# Patient Record
Sex: Female | Born: 1935 | Race: White | Hispanic: No
Health system: Southern US, Community
[De-identification: ages and names within clinical notes are randomized; demographics above are authoritative.]

## PROBLEM LIST (undated history)

## (undated) ENCOUNTER — Emergency Department (HOSPITAL_BASED_OUTPATIENT_CLINIC_OR_DEPARTMENT_OTHER): Discharge status: Against Medical Advice | Payer: PRIVATE HEALTH INSURANCE

## (undated) DIAGNOSIS — F329 Major depressive disorder, single episode, unspecified: Secondary | ICD-10-CM

## (undated) DIAGNOSIS — K5792 Diverticulitis of intestine, part unspecified, without perforation or abscess without bleeding: Secondary | ICD-10-CM

## (undated) DIAGNOSIS — G629 Polyneuropathy, unspecified: Secondary | ICD-10-CM

## (undated) DIAGNOSIS — J309 Allergic rhinitis, unspecified: Secondary | ICD-10-CM

## (undated) DIAGNOSIS — H9190 Unspecified hearing loss, unspecified ear: Secondary | ICD-10-CM

## (undated) DIAGNOSIS — I739 Peripheral vascular disease, unspecified: Secondary | ICD-10-CM

## (undated) DIAGNOSIS — M25552 Pain in left hip: Secondary | ICD-10-CM

## (undated) DIAGNOSIS — F32A Depression, unspecified: Secondary | ICD-10-CM

## (undated) DIAGNOSIS — M545 Low back pain, unspecified: Secondary | ICD-10-CM

## (undated) DIAGNOSIS — K279 Peptic ulcer, site unspecified, unspecified as acute or chronic, without hemorrhage or perforation: Secondary | ICD-10-CM

## (undated) DIAGNOSIS — K589 Irritable bowel syndrome without diarrhea: Secondary | ICD-10-CM

## (undated) DIAGNOSIS — R519 Headache, unspecified: Secondary | ICD-10-CM

## (undated) DIAGNOSIS — M549 Dorsalgia, unspecified: Secondary | ICD-10-CM

## (undated) DIAGNOSIS — I10 Essential (primary) hypertension: Secondary | ICD-10-CM

## (undated) DIAGNOSIS — F0390 Unspecified dementia without behavioral disturbance: Secondary | ICD-10-CM

## (undated) DIAGNOSIS — K5900 Constipation, unspecified: Secondary | ICD-10-CM

## (undated) DIAGNOSIS — L309 Dermatitis, unspecified: Secondary | ICD-10-CM

## (undated) DIAGNOSIS — G8929 Other chronic pain: Secondary | ICD-10-CM

## (undated) DIAGNOSIS — R51 Headache: Secondary | ICD-10-CM

## (undated) DIAGNOSIS — R251 Tremor, unspecified: Secondary | ICD-10-CM

## (undated) DIAGNOSIS — I719 Aortic aneurysm of unspecified site, without rupture: Secondary | ICD-10-CM

## (undated) DIAGNOSIS — I359 Nonrheumatic aortic valve disorder, unspecified: Secondary | ICD-10-CM

## (undated) HISTORY — PX: OTHER SURGICAL HISTORY: SHX169

---

## 2012-02-11 ENCOUNTER — Emergency Department (HOSPITAL_COMMUNITY): Payer: Medicare Other

## 2012-02-11 ENCOUNTER — Observation Stay (HOSPITAL_COMMUNITY)
Admission: EM | Admit: 2012-02-11 | Discharge: 2012-02-12 | Payer: Medicare Other | Attending: Family Medicine | Admitting: Family Medicine

## 2012-02-11 ENCOUNTER — Encounter (HOSPITAL_COMMUNITY): Payer: Self-pay

## 2012-02-11 DIAGNOSIS — R42 Dizziness and giddiness: Principal | ICD-10-CM | POA: Diagnosis present

## 2012-02-11 DIAGNOSIS — F329 Major depressive disorder, single episode, unspecified: Secondary | ICD-10-CM | POA: Insufficient documentation

## 2012-02-11 DIAGNOSIS — I671 Cerebral aneurysm, nonruptured: Secondary | ICD-10-CM | POA: Insufficient documentation

## 2012-02-11 DIAGNOSIS — G609 Hereditary and idiopathic neuropathy, unspecified: Secondary | ICD-10-CM | POA: Insufficient documentation

## 2012-02-11 DIAGNOSIS — F0280 Dementia in other diseases classified elsewhere without behavioral disturbance: Secondary | ICD-10-CM | POA: Insufficient documentation

## 2012-02-11 DIAGNOSIS — F0390 Unspecified dementia without behavioral disturbance: Secondary | ICD-10-CM | POA: Diagnosis present

## 2012-02-11 DIAGNOSIS — I6529 Occlusion and stenosis of unspecified carotid artery: Secondary | ICD-10-CM | POA: Insufficient documentation

## 2012-02-11 DIAGNOSIS — F3289 Other specified depressive episodes: Secondary | ICD-10-CM | POA: Insufficient documentation

## 2012-02-11 DIAGNOSIS — G309 Alzheimer's disease, unspecified: Secondary | ICD-10-CM | POA: Insufficient documentation

## 2012-02-11 DIAGNOSIS — I658 Occlusion and stenosis of other precerebral arteries: Secondary | ICD-10-CM | POA: Insufficient documentation

## 2012-02-11 DIAGNOSIS — I10 Essential (primary) hypertension: Secondary | ICD-10-CM | POA: Diagnosis present

## 2012-02-11 DIAGNOSIS — I672 Cerebral atherosclerosis: Secondary | ICD-10-CM | POA: Insufficient documentation

## 2012-02-11 DIAGNOSIS — F0150 Vascular dementia without behavioral disturbance: Secondary | ICD-10-CM | POA: Insufficient documentation

## 2012-02-11 DIAGNOSIS — F32A Depression, unspecified: Secondary | ICD-10-CM | POA: Diagnosis present

## 2012-02-11 DIAGNOSIS — G629 Polyneuropathy, unspecified: Secondary | ICD-10-CM | POA: Diagnosis present

## 2012-02-11 DIAGNOSIS — Z8673 Personal history of transient ischemic attack (TIA), and cerebral infarction without residual deficits: Secondary | ICD-10-CM | POA: Insufficient documentation

## 2012-02-11 DIAGNOSIS — W19XXXA Unspecified fall, initial encounter: Secondary | ICD-10-CM | POA: Diagnosis present

## 2012-02-11 DIAGNOSIS — R1032 Left lower quadrant pain: Secondary | ICD-10-CM | POA: Insufficient documentation

## 2012-02-11 DIAGNOSIS — R51 Headache: Secondary | ICD-10-CM | POA: Insufficient documentation

## 2012-02-11 DIAGNOSIS — Z9181 History of falling: Secondary | ICD-10-CM | POA: Insufficient documentation

## 2012-02-11 HISTORY — DX: Dorsalgia, unspecified: M54.9

## 2012-02-11 HISTORY — DX: Constipation, unspecified: K59.00

## 2012-02-11 HISTORY — DX: Dermatitis, unspecified: L30.9

## 2012-02-11 HISTORY — DX: Major depressive disorder, single episode, unspecified: F32.9

## 2012-02-11 HISTORY — DX: Polyneuropathy, unspecified: G62.9

## 2012-02-11 HISTORY — DX: Unspecified dementia, unspecified severity, without behavioral disturbance, psychotic disturbance, mood disturbance, and anxiety: F03.90

## 2012-02-11 HISTORY — DX: Essential (primary) hypertension: I10

## 2012-02-11 HISTORY — DX: Depression, unspecified: F32.A

## 2012-02-11 LAB — CBC WITH DIFFERENTIAL/PLATELET
Basophils Absolute: 0 10*3/uL (ref 0.0–0.1)
Basophils Relative: 0 % (ref 0–1)
MCHC: 32.4 g/dL (ref 30.0–36.0)
Neutro Abs: 4.8 10*3/uL (ref 1.7–7.7)
Neutrophils Relative %: 55 % (ref 43–77)
RDW: 13.4 % (ref 11.5–15.5)

## 2012-02-11 LAB — CARDIAC PANEL(CRET KIN+CKTOT+MB+TROPI)
CK, MB: 3.2 ng/mL (ref 0.3–4.0)
Total CK: 77 U/L (ref 7–177)

## 2012-02-11 LAB — LACTIC ACID, PLASMA: Lactic Acid, Venous: 2.1 mmol/L (ref 0.5–2.2)

## 2012-02-11 LAB — COMPREHENSIVE METABOLIC PANEL
AST: 17 U/L (ref 0–37)
Albumin: 3.4 g/dL — ABNORMAL LOW (ref 3.5–5.2)
Chloride: 109 mEq/L (ref 96–112)
Creatinine, Ser: 0.95 mg/dL (ref 0.50–1.10)
Potassium: 5.1 mEq/L (ref 3.5–5.1)
Total Bilirubin: 0.4 mg/dL (ref 0.3–1.2)

## 2012-02-11 LAB — PROTIME-INR: INR: 1.1 (ref 0.00–1.49)

## 2012-02-11 LAB — URINALYSIS, ROUTINE W REFLEX MICROSCOPIC
Hgb urine dipstick: NEGATIVE
Leukocytes, UA: NEGATIVE
Nitrite: NEGATIVE
Specific Gravity, Urine: 1.014 (ref 1.005–1.030)
Urobilinogen, UA: 1 mg/dL (ref 0.0–1.0)

## 2012-02-11 MED ORDER — GABAPENTIN 300 MG PO CAPS
300.0000 mg | ORAL_CAPSULE | Freq: Three times a day (TID) | ORAL | Status: DC
  Administered 2012-02-12 (×3): 300 mg via ORAL
  Filled 2012-02-11 (×4): qty 1

## 2012-02-11 MED ORDER — DONEPEZIL HCL 10 MG PO TABS
10.0000 mg | ORAL_TABLET | Freq: Every day | ORAL | Status: DC
  Filled 2012-02-11: qty 1

## 2012-02-11 MED ORDER — IOHEXOL 300 MG/ML  SOLN
20.0000 mL | INTRAMUSCULAR | Status: AC
  Administered 2012-02-11 (×2): 20 mL via ORAL

## 2012-02-11 MED ORDER — IOHEXOL 300 MG/ML  SOLN
80.0000 mL | Freq: Once | INTRAMUSCULAR | Status: AC | PRN
  Administered 2012-02-11: 80 mL via INTRAVENOUS

## 2012-02-11 MED ORDER — HEPARIN SODIUM (PORCINE) 5000 UNIT/ML IJ SOLN
5000.0000 [IU] | Freq: Three times a day (TID) | INTRAMUSCULAR | Status: DC
  Administered 2012-02-12: 5000 [IU] via SUBCUTANEOUS
  Filled 2012-02-11 (×5): qty 1

## 2012-02-11 MED ORDER — TOPIRAMATE 25 MG PO TABS
150.0000 mg | ORAL_TABLET | Freq: Every day | ORAL | Status: DC
  Administered 2012-02-12: 150 mg via ORAL
  Filled 2012-02-11: qty 2

## 2012-02-11 MED ORDER — ASPIRIN 81 MG PO CHEW
81.0000 mg | CHEWABLE_TABLET | Freq: Every day | ORAL | Status: DC
  Administered 2012-02-12: 81 mg via ORAL
  Filled 2012-02-11: qty 1

## 2012-02-11 MED ORDER — QUETIAPINE FUMARATE 25 MG PO TABS
25.0000 mg | ORAL_TABLET | Freq: Two times a day (BID) | ORAL | Status: DC
  Administered 2012-02-12 (×2): 25 mg via ORAL
  Filled 2012-02-11 (×3): qty 1

## 2012-02-11 MED ORDER — PROPRANOLOL HCL ER 60 MG PO CP24
60.0000 mg | ORAL_CAPSULE | Freq: Every day | ORAL | Status: DC
  Administered 2012-02-12: 60 mg via ORAL
  Filled 2012-02-11: qty 1

## 2012-02-11 MED ORDER — GADOBENATE DIMEGLUMINE 529 MG/ML IV SOLN
11.0000 mL | Freq: Once | INTRAVENOUS | Status: AC | PRN
  Administered 2012-02-11: 11 mL via INTRAVENOUS

## 2012-02-11 MED ORDER — DOCUSATE SODIUM 50 MG PO CAPS
250.0000 mg | ORAL_CAPSULE | Freq: Every day | ORAL | Status: DC
  Administered 2012-02-12: 250 mg via ORAL
  Filled 2012-02-11: qty 1

## 2012-02-11 MED ORDER — ALUM & MAG HYDROXIDE-SIMETH 200-200-20 MG/5ML PO SUSP: 15.0000 mL | Freq: Four times a day (QID) | ORAL | Status: DC | PRN

## 2012-02-11 MED ORDER — DULOXETINE HCL 60 MG PO CPEP
60.0000 mg | ORAL_CAPSULE | Freq: Every day | ORAL | Status: DC
  Administered 2012-02-12: 60 mg via ORAL
  Filled 2012-02-11: qty 1

## 2012-02-11 MED ORDER — SODIUM CHLORIDE 0.9 % IJ SOLN
3.0000 mL | Freq: Two times a day (BID) | INTRAMUSCULAR | Status: DC
  Administered 2012-02-11 – 2012-02-12 (×2): 3 mL via INTRAVENOUS

## 2012-02-11 MED ORDER — TRAMADOL HCL 50 MG PO TABS: 50.0000 mg | ORAL_TABLET | Freq: Four times a day (QID) | ORAL | Status: DC | PRN

## 2012-02-11 NOTE — ED Notes (Signed)
Pt is currently in Radiology  

## 2012-02-11 NOTE — ED Notes (Signed)
Called CT to let them know that the pt  has finished her contrast

## 2012-02-11 NOTE — ED Notes (Signed)
Pt son Earleen Aoun called to leave number to let him know what pt is returning [facility] to [facility] [phone]

## 2012-02-11 NOTE — Consult Note (Signed)
Patient ID: Sarah Alvarado MRN: [id number], DOB/AGE: Aug 14, 1931   Admit date: 02/11/2012 Date of Consult: @TODAY @  Primary Physician: No primary provider on file. Primary Cardiologist: new   Problem List: Past Medical History  Diagnosis Date  . Hypertension   . Depression   . Dementia   . Back pain   . Neuropathy     peripheral  . Constipation   . Eczema     History reviewed. No pertinent past surgical history.   Allergies: No Known Allergies  HPI:  Patient is a 78 year old who we are asked to see re abnormal EKG The patient presents today after a fall Today she felt a little dizzy, as she often does with change in postion.  She was getting out of bed and thinks she may have caught her foot. She had a "mild" fall.  Denies syncope  She then developed L sided pain.  Came to ER  She denies syncope in past.  She denies CP, only rarely.  She denies problems breathing. She does say she is dizzy a lot.  Tries to be careful.  Patient no longer has primary physician.   Inpatient Medications:    . iohexol  20 mL Oral Q1 Hr x 2    History reviewed. No pertinent family history.   History   Social History  . Marital Status: Widowed    Spouse Name: N/A    Number of Children: N/A  . Years of Education: N/A   Occupational History  . Not on file.   Social History Main Topics  . Smoking status: Not on file  . Smokeless tobacco: Not on file  . Alcohol Use: No  . Drug Use: No  . Sexually Active: No   Other Topics Concern  . Not on file   Social History Narrative  . No narrative on file     Review of Systems: General: negative for chills, fever, night sweats or weight changes.  Cardiovascular: negative for chest pain, dyspnea on exertion, edema, orthopnea, palpitations, paroxysmal nocturnal dyspnea or shortness of breath  Respiratory: mild cough due to weather.  Urologic: GI:  No vomiting No diarrhea  Neuro:  Headache severe.  Patient a very difficult historian  due to dementia.  Gets a little agitated with questioning.  Physical Exam: Filed Vitals:   02/11/12 1437  BP: 162/96  Pulse: 71  Temp: 97.5 F (36.4 C)  Resp: 17   No intake or output data in the 24 hours ending 02/11/12 1610  General: Well developed, well nourished, in no acute distress. Head: Normocephalic, atraumatic, sclera non-icteric Neck: Bilateral burits  JVP not elevated. Lungs: Clear bilaterally to auscultation without wheezes, rales, or rhonchi. Breathing is unlabored. Heart: RRR with S1 S2. No S3 or S4  Grade II/VI systolic murmur LSB. Abdomen: Soft, non-tender, non-distended with normoactive bowel sounds. No hepatomegaly. No rebound/guarding. No obvious abdominal masses. Msk:  Strength and tone appears normal for age. Extremities: No clubbing, cyanosis or edema.  Distal pedal pulses are 2+ and equal bilaterally. Neuro: Alert and oriented X 3. Moves all extremities spontaneously. Psych:  Responds to questions appropriately with a normal affect.  Labs: Results for orders placed during the hospital encounter of 02/11/12 (from the past 24 hour(s))  LACTIC ACID, PLASMA     Status: Normal   Collection Time   02/11/12 11:41 AM      Component Value Range   Lactic Acid, Venous 2.1  0.5 - 2.2 mmol/L  CBC WITH DIFFERENTIAL  Status: Normal   Collection Time   02/11/12 11:50 AM      Component Value Range   WBC 8.8  4.0 - 10.5 K/uL   RBC 3.99  3.87 - 5.11 MIL/uL   Hemoglobin 12.6  12.0 - 15.0 g/dL   HCT 16.1  09.6 - 04.5 %   MCV 97.5  78.0 - 100.0 fL   MCH 31.6  26.0 - 34.0 pg   MCHC 32.4  30.0 - 36.0 g/dL   RDW 40.9  81.1 - 91.4 %   Platelets 253  150 - 400 K/uL   Neutrophils Relative 55  43 - 77 %   Neutro Abs 4.8  1.7 - 7.7 K/uL   Lymphocytes Relative 34  12 - 46 %   Lymphs Abs 2.9  0.7 - 4.0 K/uL   Monocytes Relative 8  3 - 12 %   Monocytes Absolute 0.7  0.1 - 1.0 K/uL   Eosinophils Relative 3  0 - 5 %   Eosinophils Absolute 0.3  0.0 - 0.7 K/uL   Basophils  Relative 0  0 - 1 %   Basophils Absolute 0.0  0.0 - 0.1 K/uL  COMPREHENSIVE METABOLIC PANEL     Status: Abnormal   Collection Time   02/11/12 11:50 AM      Component Value Range   Sodium 144  135 - 145 mEq/L   Potassium 5.1  3.5 - 5.1 mEq/L   Chloride 109  96 - 112 mEq/L   CO2 26  19 - 32 mEq/L   Glucose, Bld 95  70 - 99 mg/dL   BUN 20  6 - 23 mg/dL   Creatinine, Ser 7.82  0.50 - 1.10 mg/dL   Calcium 9.7  8.4 - 95.6 mg/dL   Total Protein 6.6  6.0 - 8.3 g/dL   Albumin 3.4 (*) 3.5 - 5.2 g/dL   AST 17  0 - 37 U/L   ALT 8  0 - 35 U/L   Alkaline Phosphatase 72  39 - 117 U/L   Total Bilirubin 0.4  0.3 - 1.2 mg/dL   GFR calc non Af Amer 56 (*) >90 mL/min   GFR calc Af Amer 65 (*) >90 mL/min  PROTIME-INR     Status: Normal   Collection Time   02/11/12 11:50 AM      Component Value Range   Prothrombin Time 14.4  11.6 - 15.2 seconds   INR 1.10  0.00 - 1.49  LIPASE, BLOOD     Status: Abnormal   Collection Time   02/11/12 11:50 AM      Component Value Range   Lipase 154 (*) 11 - 59 U/L  CARDIAC PANEL(CRET KIN+CKTOT+MB+TROPI)     Status: Normal   Collection Time   02/11/12 11:54 AM      Component Value Range   Total CK 77  7 - 177 U/L   CK, MB 3.2  0.3 - 4.0 ng/mL   Troponin I <0.30  <0.30 ng/mL   Relative Index RELATIVE INDEX IS INVALID  0.0 - 2.5  URINALYSIS, ROUTINE W REFLEX MICROSCOPIC     Status: Normal   Collection Time   02/11/12 12:05 PM      Component Value Range   Color, Urine YELLOW  YELLOW   APPearance CLEAR  CLEAR   Specific Gravity, Urine 1.014  1.005 - 1.030   pH 7.5  5.0 - 8.0   Glucose, UA NEGATIVE  NEGATIVE mg/dL   Hgb urine dipstick NEGATIVE  NEGATIVE   Bilirubin Urine NEGATIVE  NEGATIVE   Ketones, ur NEGATIVE  NEGATIVE mg/dL   Protein, ur NEGATIVE  NEGATIVE mg/dL   Urobilinogen, UA 1.0  0.0 - 1.0 mg/dL   Nitrite NEGATIVE  NEGATIVE   Leukocytes, UA NEGATIVE  NEGATIVE    Radiology/Studies: Dg Chest 2 View  02/11/2012  *RADIOLOGY REPORT*  Clinical Data: Headache  and dizziness.  CHEST - 2 VIEW  Comparison: None  Findings: The cardiac silhouette, mediastinal and hilar contours are within normal limits.  The lungs are clear of acute findings. Mild hyperinflation is noted.  There is a rounded density in the left upper lobe which is most likely a calcified granuloma. Comparison with prior studies would be helpful for confirmation. If none are available a follow-up examination in 4-6 months is recommended.  The bony thorax is intact.  Remote healed right-sided rib fractures are noted.  IMPRESSION:  1.  No acute cardiopulmonary findings. 2.  Probable left upper lobe calcified granuloma as discussed above.  Original Report Authenticated By: P. Loralie Champagne, M.D.   Ct Head Wo Contrast  02/11/2012  *RADIOLOGY REPORT*  Clinical Data: Dizziness, headache  CT HEAD WITHOUT CONTRAST  Technique:  Contiguous axial images were obtained from the base of the skull through the vertex without contrast.  Comparison: None.  Findings: No skull fracture is noted.  Paranasal sinuses shows nodular mucosal thickening left maxillary sinus probable mucous retention cysts the largest measures 1 cm.  No  intracranial hemorrhage, mass effect or midline shift.  Mild cerebral atrophy.  Periventricular and subcortical white matter decreased attenuation is probable due to chronic small vessel ischemic changes.  No acute infarction.  No mass lesion is noted on this unenhanced scan.  IMPRESSION:  No acute intracranial abnormality.  Mild cerebral atrophy. Periventricular and subcortical white matter decreased attenuation probable due to chronic small vessel ischemic changes. Probable mucous retention cysts left maxillary sinus the largest measures 1 cm.  Original Report Authenticated By: Natasha Mead, M.D.   Ct Abdomen Pelvis W Contrast  02/11/2012  *RADIOLOGY REPORT*  Clinical Data: Left hip and flank pain.  Fall.  Headache. Weakness.  CT ABDOMEN AND PELVIS WITH CONTRAST  Technique:  Multidetector CT imaging  of the abdomen and pelvis was performed following the standard protocol during bolus administration of intravenous contrast.  Contrast: 80mL OMNIPAQUE IOHEXOL 300 MG/ML  SOLN, 1 OMNIPAQUE IOHEXOL 300 MG/ML  SOLN  Comparison: None.  Findings: Lung bases show no acute findings.  Heart size normal. No pericardial or pleural effusion.  A sub centimeter low attenuation lesion in the right hepatic lobe is too small to characterize.  Liver, gallbladder and right adrenal gland are otherwise unremarkable.  There may be mild nodularity/thickening involving the left adrenal gland.  Right kidney is unremarkable.  A 1 cm low attenuation lesion in the upper pole left kidney is too small to characterize.  Statistically, this likely represents a cyst.  Spleen, pancreas, stomach and small bowel are unremarkable.  A fair amount of stool is seen in the colon.  There is aortobi-iliac graft repair of the aorta, which is atherosclerotic.  No pathologically enlarged lymph nodes.  No free fluid.  No worrisome lytic or sclerotic lesions.  Prominent Schmorl's nodes are seen along the inferior endplates of L4 and L5.  IMPRESSION: No acute findings.  No findings to explain the patient's given symptoms.  Original Report Authenticated By: Reyes Ivan, M.D.    EKG:  SR.  Incomp RBBB  Possible IWMI  Nonspecific T wave changes.  QTc is 477   ASSESSMENT AND PLAN:   Patient is a  78 year old with a history of HTN  Also history of signif dizziness.  Today fell.  She denies syncope though not a good historian On exam:  Hypertensive.  Neck with bruits  Patient poor historian  Otherwise unremarkabel.  EKG with Incomp RBBB and nonspecific ST T wave changes.  QT is mildly prolonged I do not see orthostatics have been checked I do not see any EKG changes that pinpoint etiology of dizziness nor that are concerning for ischemia.  I would recomm checking orthostatics.    2.  HTN.  Would follow.  Check orthostatics.  Rx slowly  Do not want  to exacerbate above   Will be available as needed.   Signed, Dietrich Pates 02/11/2012, 4:10 PM

## 2012-02-11 NOTE — H&P (Signed)
Family Medicine Teaching [facility] Admission History and Physical Service Pager: [phone]  Patient name: Sarah Alvarado Medical record number: [id number] Date of birth: 12/05/1931 Age: 78 y.o. Gender: female  Primary Care Provider: No primary provider on file.  Chief Complaint: Left-sided side pain and dizziness  Assessment and Plan: Sarah Alvarado is a 78 y.o. year old female presenting with side pain and dizziness 1. Left side pain: Patient has been seen and evaluated by cardiology. They do not feel that the patient's discomfort is cardiac in nature. They feel there are no acute changes on her EKG and she has had a single negative set of troponins. They are not suggest any further workup. I will repeat a troponin in the morning but, unless further symptoms appear, will not pursue further cardiac workup. 2. Dizziness: History somewhat hard to obtain from the patient. There may be a history of some type of fall but this is somewhat unclear. The patient is noted to have likely high-grade stenoses on MRA of the neck. These will need to be evaluated with carotid Dopplers. Once this is done we can have a discussion with the family about where we go from here. 1. Carotid doplers 2. PT/OT to assess for any obvious signs of vertigo/orthostasis 3. Orthostatic vitals in AM 3. Hypertension: The patient is on a number of antihypertensives. Due to the possibility of orthostasis contributing to the patient's problems, we will have to carefully review her blood pressure medications and watch her blood pressure or the next 12-14 hours. In this patient it would be very reasonable for her blood pressure to be around 160 systolic to avoid symptomatic hypotension. 4. 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 Will continue outpatient medications. (Aricept and Seroquel) 5. Peripheral neuropathy: Unclear cause. Continue gabapentin. 6. Probable headaches: The patient appears to be on both  extended-release propranolol and topiramate. I was unable to determine exactly what these medications work for. I will continue them while she is here as an inpatient. I would imagine that they are for some form of headache prophylaxis. It is possible, however, that they may be contributing to her symptoms and may need to be discontinued. 7. FEN/GI: Regular diet 8. Prophylaxis: Subcutaneous heparin 9. Disposition: Telemetry bed, observation status 10. Code Status: The family wants reasonable medical care. Patient's living will states no intubation or life support if the patient's condition is deemed likely be reversible by 2 physicians  History of Present Illness: Sarah Alvarado is a 78 y.o. year old female presenting with somewhat unclear history.  The patient has moderately advanced dementia, likely with an underlying Alzheimer's component and a superimposed vascular dementia related to multiple small strokes 6-8 months ago. She lives at a skilled nursing facility and today started to complain of some lower left side pain. There is also some report of some dizziness and unsteadiness. I am unclear if there have been any actual falls. Conversation with one of the patient's sons reveals that the patient has a hard time communicating the concept of pain and so I am unclear whether this pain is new onset or has been present for some time. The patient is not able to care arise the pain but points to her left side 4-5 inches below her ribs when asked where the pain is located. The patient also says that she has problems with feeling dizzy when she sits up or stands up. She is not able to characterize this dizziness very well.  The patient was transferred  to the ER do to the above complaints. In the emergency department the patient was seen by cardiology who did not feel that the patient's pain was consistent with cardiac pain. They also felt that her EKG showed no acute findings. MRI and MRA were performed.  While there was motion artifact, there is no evidence for acute stroke. There was evidence for carotid artery lesions with significant stenosis bilaterally but this could not be fully characterized in the study. As the Doppler technician's had left for the day, overnight observation was requested until the study to be performed.  Patient Active Problem List  Diagnosis  . Fall  . Dizzy  . Hypertension  . Dementia  . Peripheral neuropathy  . Depression   Past Medical History: Past Medical History  Diagnosis Date  . Hypertension   . Depression   . Dementia   . Back pain   . Neuropathy     peripheral  . Constipation   . Eczema    Past Surgical History: History reviewed. No pertinent past surgical history. Social History: History  Substance Use Topics  . Smoking status: Unknown If Ever Smoked  . Smokeless tobacco: Not on file  . Alcohol Use: No   For any additional social history documentation, please refer to relevant sections of EMR.  Family History: Family History  Problem Relation Age of Onset  . Family history unknown: Yes   Allergies: No Known Allergies No current facility-administered medications on file prior to encounter.   Current Outpatient Prescriptions on File Prior to Encounter  Medication Sig Dispense Refill  . donepezil (ARICEPT) 10 MG tablet Take 10 mg by mouth at bedtime.      . DULoxetine (CYMBALTA) 60 MG capsule Take 60 mg by mouth daily.      Marland Kitchen gabapentin (NEURONTIN) 300 MG capsule Take 300 mg by mouth 3 (three) times daily.      . propranolol ER (INDERAL LA) 60 MG 24 hr capsule Take 60 mg by mouth daily.      . QUEtiapine (SEROQUEL) 25 MG tablet Take 25 mg by mouth 2 (two) times daily.      . SUMAtriptan (IMITREX) 50 MG tablet Take 50 mg by mouth every 2 (two) hours as needed. For headache      . topiramate (TOPAMAX) 50 MG tablet Take 150 mg by mouth daily.       Review Of Systems: Unable to obtain secondary to dementia  Physical Exam: BP  155/73  Pulse 65  Temp 98.1 F (36.7 C) (Oral)  Resp 16  SpO2 96% Exam: General: NAD, pleasant but demented HEENT: TM normal b/l EOMI, PERRL, MMM, trachea midline Cardiovascular: 3/6 systolic murmur radiating to carotids bilaterally, right sided carotid bruit Respiratory: CTABL Abdomen: SNTND, no pain on palpation of left side Extremities: No edema, moves spontaneously Skin: No bruising or rashes Neuro: CN2-12 intact, patient oriented to person only  Labs and Imaging: CBC BMET   Lab 02/11/12 1150  WBC 8.8  HGB 12.6  HCT 38.9  PLT 253    Lab 02/11/12 1150  NA 144  K 5.1  CL 109  CO2 26  BUN 20  CREATININE 0.95  GLUCOSE 95  CALCIUM 9.7    MRI/MRA:  3.2 mm aneurysm medial aspect left internal carotid artery cavernous segment.  1.6 mm aneurysm medial aspect right internal carotid artery cavernous segment.  Anterior circulation without medium or large size vessel significant stenosis or occlusion.  Mild to moderate middle cerebral artery branch vessel irregularity.  Abnormal appearance of the left vertebral artery which appears markedly narrowed.  Poor delineation of the PICAs.  Moderate to slightly marked narrowing proximal to mid basilar Artery.  Posterior cerebral artery branch vessel irregularity bilaterally.  Poor delineation superior cerebellar arteries.  Normal configuration of the origin of the great vessels from the aortic arch.  Mild to moderate narrowing proximal left common carotid artery.  Narrowing at the carotid bifurcation bilaterally. Artifact may partially contribute to this appearance. Accurate assessment of degree of stenosis of the proximal internal carotid artery is limited. Findings are suggestive of at least 60 - 70% diameter stenosis of the proximal right internal carotid artery and 50-60% diameter stenosis proximal left internal carotid artery. If further delineation is clinically desired, correlation with ultrasound or CT  angiogram may be considered.  Moderate to marked tandem stenosis proximal left subclavian artery.  Multiple moderate to marked stenosis throughout the left vertebral artery.  Mild irregularity right subclavian artery and right vertebral artery.  CT Head: Negative for acute process CT Abd/Pelvis: No acute process CXR: No acute process  Irlanda Croghan, MD 02/11/2012, 10:50 PM

## 2012-02-11 NOTE — ED Notes (Signed)
Called Radiology per EDP request to have CT wo contrast immediatiely

## 2012-02-11 NOTE — ED Notes (Signed)
Pt is currently in radiology. 

## 2012-02-11 NOTE — ED Notes (Signed)
Pt is back in room from radiology 

## 2012-02-11 NOTE — ED Provider Notes (Signed)
History     CSN: [id number]  Arrival date & time 02/11/12  1110   First MD Initiated Contact with Patient 02/11/12 1125      Chief Complaint  Patient presents with  . Dizziness  . Headache    (Consider location/radiation/quality/duration/timing/severity/associated sxs/prior treatment) HPI Comments: Patient presents from nursing home with episodes of dizziness and lightheadedness and stumbling started this morning. She complains of pain in her left lower quadrant as well as intermittent head pain for the past several days. She's a history dementia and is unreliable historian. She denies any chest pain or shortness of breath. She denies any headache currently. She has no focal neurological deficits. Denies any fever, vomiting, diarrhea or constipation.  The history is provided by the patient.    Past Medical History  Diagnosis Date  . Hypertension   . Depression   . Dementia   . Back pain   . Neuropathy     peripheral  . Constipation   . Eczema     History reviewed. No pertinent past surgical history.  History reviewed. No pertinent family history.  History  Substance Use Topics  . Smoking status: Not on file  . Smokeless tobacco: Not on file  . Alcohol Use: No    OB History    Grav Para Term Preterm Abortions TAB SAB Ect Mult Living                  Review of Systems  Unable to perform ROS: Dementia    Allergies  Review of patient's allergies indicates no known allergies.  Home Medications   Current Outpatient Rx  Name Route Sig Dispense Refill  . ALUM & MAG HYDROXIDE-SIMETH 200-200-20 MG/5ML PO SUSP Oral Take 15 mLs by mouth every 6 (six) hours as needed. For indigestion    . ASPIRIN 81 MG PO CHEW Oral Chew 81 mg by mouth daily.    Marland Kitchen CRANBERRY 475 MG PO CAPS Oral Take 1 capsule by mouth 2 (two) times daily.    Marland Kitchen DOCUSATE SODIUM 250 MG PO CAPS Oral Take 250 mg by mouth daily.    . DONEPEZIL HCL 10 MG PO TABS Oral Take 10 mg by mouth at bedtime.    .  DULOXETINE HCL 60 MG PO CPEP Oral Take 60 mg by mouth daily.    Marland Kitchen FLUOCINOLONE ACETONIDE 0.01 % EX CREA Topical Apply 1 application topically 2 (two) times daily as needed. To affected areas    . GABAPENTIN 300 MG PO CAPS Oral Take 300 mg by mouth 3 (three) times daily.    Marland Kitchen MAGNESIUM HYDROXIDE 400 MG/5ML PO SUSP Oral Take 15 mLs by mouth daily as needed. For constipation    . PROPRANOLOL HCL ER 60 MG PO CP24 Oral Take 60 mg by mouth daily.    . QUETIAPINE FUMARATE 25 MG PO TABS Oral Take 25 mg by mouth 2 (two) times daily.    . SUMATRIPTAN SUCCINATE 50 MG PO TABS Oral Take 50 mg by mouth every 2 (two) hours as needed. For headache    . TOPIRAMATE 50 MG PO TABS Oral Take 150 mg by mouth daily.    . TRAMADOL HCL 50 MG PO TABS Oral Take 50 mg by mouth 4 (four) times daily as needed. For pain      BP 159/93  Pulse 64  Temp 97.5 F (36.4 C) (Oral)  Resp 16  SpO2 98%  Physical Exam  Constitutional: She is oriented to person, place, and time. She  appears well-developed and well-nourished. No distress.       Oriented x3, follows commands  HENT:  Head: Normocephalic.  Mouth/Throat: Oropharynx is clear and moist. No oropharyngeal exudate.  Eyes: Conjunctivae are normal. Pupils are equal, round, and reactive to light.  Neck: Normal range of motion.  Cardiovascular: Normal rate, regular rhythm and normal heart sounds.   No murmur heard. Pulmonary/Chest: Effort normal and breath sounds normal. No respiratory distress.  Abdominal: Soft. There is tenderness. There is no rebound and no guarding.       Tender to palpation left lower quadrant without guarding or rebound  Musculoskeletal: Normal range of motion. She exhibits no edema and no tenderness.  Neurological: She is alert and oriented to person, place, and time. No cranial nerve deficit.       No facial asymmetry, 5 out of 5 strength throughout, no ataxia finger to nose  Skin: Skin is warm.    ED Course  Procedures (including critical  care time)  Labs Reviewed  COMPREHENSIVE METABOLIC PANEL - Abnormal; Notable for the following:    Albumin 3.4 (*)     GFR calc non Af Amer 56 (*)     GFR calc Af Amer 65 (*)     All other components within normal limits  LIPASE, BLOOD - Abnormal; Notable for the following:    Lipase 154 (*)     All other components within normal limits  CBC WITH DIFFERENTIAL  PROTIME-INR  URINALYSIS, ROUTINE W REFLEX MICROSCOPIC  CARDIAC PANEL(CRET KIN+CKTOT+MB+TROPI)  LACTIC ACID, PLASMA      1. Fall   2. Dementia   3. Dizzy   4. Hypertension       MDM  Dizziness, lightheadedness, frequent falls, headache and left lower quadrant pain. Vitals stable. No distress. No focal neurological deficits. Difficult historian 2/2 dementia.  Labs unremarkable except for mildly elevated lipase. No epigastric pain. No explaination of LLQ on CT.  EKG with questionable Wellen's pathology. No chest pain or SOB.  MRI pending at time of sign out to PA [employer] to evaluate posterior circulation. Cardiology will review EKG.   Date: 02/11/2012  Rate: 82  Rhythm: normal sinus rhythm  QRS Axis: normal  Intervals: QT prolonged  ST/T Wave abnormalities: nonspecific ST/T changes  Conduction Disutrbances:right bundle branch block  Narrative Interpretation: biphasic T waves in v2 and v3,  Old EKG Reviewed: none available         Glynn Octave, MD 02/11/12 1939

## 2012-02-11 NOTE — ED Notes (Addendum)
Received care of patient from previous nurse in no apparent distress- lying in bed with eyes closed. Responds to verbal commands. Comfortable.

## 2012-02-11 NOTE — ED Notes (Signed)
Pt is currently in radioolgy

## 2012-02-11 NOTE — ED Notes (Signed)
Pt was transported in through EMS. Per EMS states she started to have Dizzy this morning around 10 a.m. Pt told nursing staff at [facility] that she has falling and stumble but there were no witnesses, pt states she has pain on her left hip and flank, pt also complained of headache and weakness on her right side of body, EMS put in 20 gauge in her left ac, pt is currently on 2 liters of O2 per EMS and unsure if pt had medications this morning.

## 2012-02-11 NOTE — ED Provider Notes (Signed)
3:58 PM Discussed with Trish with [place] cardiology due to the abnormalities on the patient's EKG.  She reports that Cardiology will come see the patient.  Assumed care of patient in the CDU.  Patient presented today with a chief complaint of dizziness.  Patient is currently awaiting a MRI, MRA head, and MRA neck.  Plan is for patient to be discharged home if results are normal.  Reassessed patient.  Patient reports that her symptoms have improved at this time.  Patient with history of dementia.  Difficult to get a good history from patient.    7:30 PM Discussed results of the MRA with Dr. Corky Downs with Radiology.  Results are as follows:   Findings are suggestive of at least 60 - 70% diameter stenosis of the proximal right internal carotid artery and 50-60% diameter stenosis proximal left internal carotid artery.  He states that it is very difficult for him to determine the level of stenosis of the Carotid arteries due to the amount of artifact.  He is recommending following up with a Carotid Doppler to better determine the degree of stenosis.  Unable to get Doppler ultrasound this evening.    8:41 PM Discussed with Family Medicine.  They report that they will come evaluate patient in the ED and admit.  Patient unable to have Carotid Doppler this evening.    Pascal Lux [place], PA-C 02/11/12 2352

## 2012-02-12 DIAGNOSIS — R55 Syncope and collapse: Secondary | ICD-10-CM

## 2012-02-12 LAB — CARDIAC PANEL(CRET KIN+CKTOT+MB+TROPI)
Relative Index: INVALID (ref 0.0–2.5)
Troponin I: <0.3 ng/mL (ref <0.30)

## 2012-02-12 MED ORDER — TOPIRAMATE 50 MG PO TABS: 150.0000 mg | ORAL_TABLET | Freq: Every day | ORAL | Status: DC

## 2012-02-12 MED ORDER — DONEPEZIL HCL 10 MG PO TABS: 10.0000 mg | ORAL_TABLET | Freq: Every day | ORAL | Status: AC

## 2012-02-12 MED ORDER — PROPRANOLOL HCL 10 MG PO TABS: 10.0000 mg | ORAL_TABLET | Freq: Three times a day (TID) | ORAL | Status: AC

## 2012-02-12 MED ORDER — TRAMADOL HCL 50 MG PO TABS: 50.0000 mg | ORAL_TABLET | Freq: Four times a day (QID) | ORAL | Status: AC | PRN

## 2012-02-12 MED ORDER — HYDRALAZINE HCL 20 MG/ML IJ SOLN
5.0000 mg | INTRAMUSCULAR | Status: DC | PRN
  Filled 2012-02-12: qty 1

## 2012-02-12 MED ORDER — GABAPENTIN 300 MG PO CAPS: 300.0000 mg | ORAL_CAPSULE | Freq: Three times a day (TID) | ORAL | Status: DC

## 2012-02-12 MED ORDER — QUETIAPINE FUMARATE 25 MG PO TABS: 25.0000 mg | ORAL_TABLET | Freq: Two times a day (BID) | ORAL | Status: AC

## 2012-02-12 MED ORDER — DULOXETINE HCL 60 MG PO CPEP: 60.0000 mg | ORAL_CAPSULE | Freq: Every day | ORAL | Status: DC

## 2012-02-12 NOTE — Progress Notes (Signed)
As outlined by Dr Tenny Craw, will be available as needed. Olga Millers

## 2012-02-12 NOTE — Evaluation (Signed)
Occupational Therapy Evaluation Patient Details Name: Sarah Alvarado MRN: [id number] DOB: 03-Aug-1932 Today's Date: 02/12/2012 Time: 4034-7425 OT Time Calculation (min): 35 min  OT Assessment / Plan / Recommendation Clinical Impression  Pt admitted with dizziness-- pt with no reports of dizziness and, in fact, denied it during eval. Pt impulsive and with little safety awareness- will need close supervision at ALF    OT Assessment  Patient needs continued OT Services    Follow Up Recommendations  Home health OT;Supervision/Assistance - 24 hour    Barriers to Discharge      Equipment Recommendations  Rolling walker with 5" wheels (per PT)    Recommendations for Other Services    Frequency  Min 2X/week    Precautions / Restrictions Precautions Precautions: Fall   Pertinent Vitals/Pain Pt with no c/o of pain or dizziness    ADL  Grooming: Performed;Wash/dry hands;Min guard Where Assessed - Grooming: Unsupported standing Lower Body Dressing: Performed;Min guard Where Assessed - Lower Body Dressing: Unsupported sit to stand Toilet Transfer: Performed;Min guard Statistician Method: Sit to Barista: Materials engineer and Hygiene: Performed;Min guard Where Assessed - Engineer, mining and Hygiene: Sit to stand from 3-in-1 or toilet Tub/Shower Transfer: Simulated;Minimal assistance Tub/Shower Transfer Method: Science writer: Walk in shower (and tub/shower) Equipment Used: Rolling walker;Gait belt Transfers/Ambulation Related to ADLs: Pt requires Min to ambulate with RW- gets tangled up in easily with turns. Min HHA for ambulation without AD. Pt with unsafe hand placement on RW and required Mod VC and assist for safe use of RW ADL Comments: Pt with little safety awareness    OT Diagnosis: Generalized weakness  OT Problem List: Impaired balance (sitting and/or standing);Decreased  knowledge of use of DME or AE;Decreased knowledge of precautions OT Treatment Interventions: Self-care/ADL training;DME and/or AE instruction;Therapeutic activities;Balance training;Patient/family education   OT Goals Acute Rehab OT Goals OT Goal Formulation: With patient Time For Goal Achievement: 02/26/12 Potential to Achieve Goals: Fair ADL Goals Pt Will Perform Grooming: with modified independence;Standing at sink ADL Goal: Grooming - Progress: Goal set today Pt Will Perform Upper Body Dressing: Independently;Sitting, bed;Sitting, chair ADL Goal: Upper Body Dressing - Progress: Goal set today Pt Will Perform Lower Body Dressing: with supervision;Sit to stand from chair;Sit to stand from bed ADL Goal: Lower Body Dressing - Progress: Goal set today Pt Will Transfer to Toilet: with modified independence;Ambulation;3-in-1;with DME ADL Goal: Toilet Transfer - Progress: Goal set today Pt Will Perform Toileting - Clothing Manipulation: Standing;Independently ADL Goal: Toileting - Clothing Manipulation - Progress: Goal set today  Visit Information  Last OT Received On: 02/12/12 Assistance Needed: +1    Subjective Data  Subjective: When I need pee, I need to go right then Patient Stated Goal: Return "home"   Prior Functioning  Home Living Lives With: Alone (@ ALF) Available Help at Discharge: Available 24 hours/day Type of Home: Assisted living Home Access: Level entry Home Layout: One level Bathroom Shower/Tub: Engineer, manufacturing systems: Standard Additional Comments: Patient unable to state the equipment that she has. Prior Function Level of Independence: Needs assistance Needs Assistance: Light Housekeeping;Meal Prep Meal Prep: Total Light Housekeeping: Total Driving: No Vocation: Retired Musician: HOH (word finding difficulty)    Cognition       Extremity/Trunk Assessment Right Upper Extremity Assessment RUE ROM/Strength/Tone: WFL for tasks  assessed Left Upper Extremity Assessment LUE ROM/Strength/Tone: WFL for tasks assessed   Mobility Bed Mobility Supine to Sit: 6: Modified independent (Device/Increase  time);With rails Sitting - Scoot to Edge of Bed: 6: Modified independent (Device/Increase time) Sit to Supine: 6: Modified independent (Device/Increase time);With rail Details for Bed Mobility Assistance: Increased time only. Transfers Sit to Stand: 5: Supervision;4: Min guard;From bed Stand to Sit: 5: Supervision;4: Min guard;To bed Details for Transfer Assistance: Patient stood several times from various surfaces without assistance   Exercise    Balance    End of Session OT - End of Session Equipment Utilized During Treatment: Gait belt Activity Tolerance: Patient tolerated treatment well Patient left: in bed;with call bell/phone within reach;with bed alarm set Nurse Communication: Mobility status  GO     Sarah Alvarado 02/12/2012, 2:49 PM

## 2012-02-12 NOTE — Progress Notes (Signed)
Pt provided with discharge education and instructions. Pt minimally confused but able to repeat back important things. A packet for facility in [place] for PT[facility] to take. IV removed with tip in tact. Heart monitor returned to front. Will call PTAR and continue to mointor until patient leaves. Kaylynn Chamblin burris, Charity fundraiser

## 2012-02-12 NOTE — Clinical Social Work Psychosocial (Signed)
     Clinical Social Work Department BRIEF PSYCHOSOCIAL ASSESSMENT 02/12/2012  Patient:  Sarah Alvarado, Sarah Alvarado     Account Number:  [id number]     Admit date:  02/11/2012  Clinical Social Worker:  Doree Albee  Date/Time:  02/12/2012 11:22 AM  Referred by:  RN  Date Referred:  02/12/2012 Referred for  ALF Placement   Other Referral:   Interview type:  Patient Other interview type:   and patient son    PSYCHOSOCIAL DATA Living Status:  FACILITY Admitted from facility:  [facility] Level of care:  Assisted Living Primary support name:  Sarah Alvarado Primary support relationship to patient:  CHILD, ADULT Degree of support available:   strong    CURRENT CONCERNS Current Concerns  Post-Acute Placement   Other Concerns:    SOCIAL WORK ASSESSMENT / PLAN CSW met with patient and patient son Sarah Alvarado regarding patient current living environment and pt discharge plans.    Pt stated, "I live with other poeple, but I'm not sure what its called" as she looked at her son.  Pt son shared that patient recently moved to [facility] 3 1/2 weeks ago. Pt was previouslly living with pt other son Sarah Alvarado in [place], [place].    Pt and pt son stated that pt would return to [facility] memory care when medically stable.    Pt was currently resting peacefully when csw assessed. Pt admitted that he rmemory was not very good. however she was enjoying [facility].    CSW left message with facility to confirm pt can return when  medically stable.   Assessment/plan status:  Psychosocial Support/Ongoing Assessment of Needs Other assessment/ plan:   and dischare planning   Information/referral to community resources:   no resources needed at this time. Pt plans to return to [facility] with supportive family and community.    PATIENTS/FAMILYS RESPONSE TO PLAN OF CARE: Pt and pt son appreciated csw concern and support. Pt and pt son are motivated for pt to  return to [facility] when medically stable.

## 2012-02-12 NOTE — Progress Notes (Signed)
Report called to [place], Charity fundraiser at [employer]. Pt awaiting carotid dopplers and then plan to d/c to SNF. Ramond Craver, RN

## 2012-02-12 NOTE — ED Provider Notes (Signed)
Medical screening examination/treatment/procedure(s) were performed by non-physician practitioner and as supervising physician I was immediately available for consultation/collaboration.  Juliet Rude. Rubin Payor, MD 02/12/12 0002

## 2012-02-12 NOTE — Progress Notes (Signed)
PGY-1 Daily Progress Note Family Medicine Teaching Service [place] [place] R. Rayel Santizo, DO Service Pager: [phone]   Subjective: Pt was upset during visit due to the fact that her memory has deteriorated.  Has had intermittent episodes of dizziness "inside her head" when she becomes anxious.  Does get anxious sometimes while standing and becomes dizzy as well.  No CP or palpitations reported.  Nurse did note her O2 sat dropped to the 80's last night and was put on 2.5 L [place] before being weaned this morning due to sat's being near 100. Pulse Ox did jump to 96 after in the 80's after placement during the night.  Objective:  VITALS Temp:  [97.5 F (36.4 C)-98.1 F (36.7 C)] 97.8 F (36.6 C) (07/02 0800) Pulse Rate:  [63-83] 72  (07/02 0800) Resp:  [14-18] 18  (07/02 0800) BP: (146-192)/(52-96) 180/77 mmHg (07/02 0800) SpO2:  [96 %-100 %] 99 % (07/02 0800)  In/Out No intake or output data in the 24 hours ending 02/12/12 1147  Physical Exam: Gen:  Moderate emotional distress HEENT: moist mucous membranes Neck: + Bruits B/L carotids CV: Regular rate and rhythm, +2 systolic murmur RUSB no  rubs or gallops PULM: clear to auscultation bilaterally. No wheezes/rales/rhonchi ABD: soft/nontender/nondistended/normal bowel sounds EXT: No edema Neuro: Alert, awake, but slightly confused about where she is.    MEDS Scheduled Meds:    . aspirin  81 mg Oral Daily  . docusate sodium  250 mg Oral Daily  . donepezil  10 mg Oral QHS  . DULoxetine  60 mg Oral Daily  . gabapentin  300 mg Oral TID  . heparin  5,000 Units Subcutaneous Q8H  . iohexol  20 mL Oral Q1 Hr x 2  . propranolol ER  60 mg Oral Daily  . QUEtiapine  25 mg Oral BID  . sodium chloride  3 mL Intravenous Q12H  . topiramate  150 mg Oral Daily   Continuous Infusions:  PRN Meds:.alum & mag hydroxide-simeth, gadobenate dimeglumine, hydrALAZINE, iohexol, traMADol  Labs and imaging:   CBC  Lab 02/11/12 1150  WBC 8.8  HGB 12.6  HCT 38.9    PLT 253   BMET/CMET  Lab 02/11/12 1150  NA 144  K 5.1  CL 109  CO2 26  BUN 20  CREATININE 0.95  CALCIUM 9.7  PROT 6.6  BILITOT 0.4  ALKPHOS 72  ALT 8  AST 17  GLUCOSE 95   Results for orders placed during the hospital encounter of 02/11/12 (from the past 24 hour(s))  LACTIC ACID, PLASMA     Status: Normal   Collection Time   02/11/12 11:41 AM      Component Value Range   Lactic Acid, Venous 2.1  0.5 - 2.2 mmol/L  CBC WITH DIFFERENTIAL     Status: Normal   Collection Time   02/11/12 11:50 AM      Component Value Range   WBC 8.8  4.0 - 10.5 K/uL   RBC 3.99  3.87 - 5.11 MIL/uL   Hemoglobin 12.6  12.0 - 15.0 g/dL   HCT 13.0  86.5 - 78.4 %   MCV 97.5  78.0 - 100.0 fL   MCH 31.6  26.0 - 34.0 pg   MCHC 32.4  30.0 - 36.0 g/dL   RDW 69.6  29.5 - 28.4 %   Platelets 253  150 - 400 K/uL   Neutrophils Relative 55  43 - 77 %   Neutro Abs 4.8  1.7 - 7.7 K/uL  Lymphocytes Relative 34  12 - 46 %   Lymphs Abs 2.9  0.7 - 4.0 K/uL   Monocytes Relative 8  3 - 12 %   Monocytes Absolute 0.7  0.1 - 1.0 K/uL   Eosinophils Relative 3  0 - 5 %   Eosinophils Absolute 0.3  0.0 - 0.7 K/uL   Basophils Relative 0  0 - 1 %   Basophils Absolute 0.0  0.0 - 0.1 K/uL  COMPREHENSIVE METABOLIC PANEL     Status: Abnormal   Collection Time   02/11/12 11:50 AM      Component Value Range   Sodium 144  135 - 145 mEq/L   Potassium 5.1  3.5 - 5.1 mEq/L   Chloride 109  96 - 112 mEq/L   CO2 26  19 - 32 mEq/L   Glucose, Bld 95  70 - 99 mg/dL   BUN 20  6 - 23 mg/dL   Creatinine, Ser 4.09  0.50 - 1.10 mg/dL   Calcium 9.7  8.4 - 81.1 mg/dL   Total Protein 6.6  6.0 - 8.3 g/dL   Albumin 3.4 (*) 3.5 - 5.2 g/dL   AST 17  0 - 37 U/L   ALT 8  0 - 35 U/L   Alkaline Phosphatase 72  39 - 117 U/L   Total Bilirubin 0.4  0.3 - 1.2 mg/dL   GFR calc non Af Amer 56 (*) >90 mL/min   GFR calc Af Amer 65 (*) >90 mL/min  PROTIME-INR     Status: Normal   Collection Time   02/11/12 11:50 AM      Component Value Range    Prothrombin Time 14.4  11.6 - 15.2 seconds   INR 1.10  0.00 - 1.49  LIPASE, BLOOD     Status: Abnormal   Collection Time   02/11/12 11:50 AM      Component Value Range   Lipase 154 (*) 11 - 59 U/L  CARDIAC PANEL(CRET KIN+CKTOT+MB+TROPI)     Status: Normal   Collection Time   02/11/12 11:54 AM      Component Value Range   Total CK 77  7 - 177 U/L   CK, MB 3.2  0.3 - 4.0 ng/mL   Troponin I <0.30  <0.30 ng/mL   Relative Index RELATIVE INDEX IS INVALID  0.0 - 2.5  URINALYSIS, ROUTINE W REFLEX MICROSCOPIC     Status: Normal   Collection Time   02/11/12 12:05 PM      Component Value Range   Color, Urine YELLOW  YELLOW   APPearance CLEAR  CLEAR   Specific Gravity, Urine 1.014  1.005 - 1.030   pH 7.5  5.0 - 8.0   Glucose, UA NEGATIVE  NEGATIVE mg/dL   Hgb urine dipstick NEGATIVE  NEGATIVE   Bilirubin Urine NEGATIVE  NEGATIVE   Ketones, ur NEGATIVE  NEGATIVE mg/dL   Protein, ur NEGATIVE  NEGATIVE mg/dL   Urobilinogen, UA 1.0  0.0 - 1.0 mg/dL   Nitrite NEGATIVE  NEGATIVE   Leukocytes, UA NEGATIVE  NEGATIVE  MRSA PCR SCREENING     Status: Normal   Collection Time   02/11/12 11:42 PM      Component Value Range   MRSA by PCR NEGATIVE  NEGATIVE  CARDIAC PANEL(CRET KIN+CKTOT+MB+TROPI)     Status: Normal   Collection Time   02/12/12  5:15 AM      Component Value Range   Total CK 83  7 - 177 U/L  CK, MB 3.1  0.3 - 4.0 ng/mL   Troponin I <0.30  <0.30 ng/mL   Relative Index RELATIVE INDEX IS INVALID  0.0 - 2.5   Dg Chest 2 View 02/11/2012   IMPRESSION:  1.  No acute cardiopulmonary findings. 2.  Probable left upper lobe calcified granuloma as discussed above.  Original Report Authenticated By: P. Loralie Champagne, M.D.   Ct Head Wo Contrast  02/11/2012  IMPRESSION:  No acute intracranial abnormality.  Mild cerebral atrophy. Periventricular and subcortical white matter decreased attenuation probable due to chronic small vessel ischemic changes. Probable mucous retention cysts left maxillary sinus the  largest measures 1 cm.  Original Report Authenticated By: Natasha Mead, M.D.     Mr [name] Wo Contrast 02/11/2012     IMPRESSION: 3.2 mm aneurysm medial aspect left internal carotid artery cavernous segment.  1.6 mm aneurysm medial aspect right internal carotid artery cavernous segment.  Anterior circulation without medium or large size vessel significant stenosis or occlusion.  Mild to moderate middle cerebral artery branch vessel irregularity.  Abnormal appearance of the left vertebral artery which appears markedly narrowed.  Poor delineation of the PICAs.  Moderate to slightly marked narrowing proximal to mid basilar artery.  Posterior cerebral artery branch vessel irregularity bilaterally.  Poor delineation superior cerebellar arteries.  MRA NECK  Findings:Evaluation limited by motion and phase of contrast administration.  Mild to moderate narrowing proximal left common carotid artery.  Narrowing at the carotid bifurcation bilaterally.  Artifact may partially contribute to this appearance.  Accurate assessment of degree of stenosis of the proximal internal carotid artery is limited.  Findings are suggestive of at least 60 - 70% diameter stenosis of the proximal right internal carotid artery and 50-60% diameter stenosis proximal left internal carotid artery.  If further delineation is clinically desired, correlation with ultrasound or CT angiogram may be considered.  Moderate to marked tandem stenosis proximal left subclavian artery.  Multiple moderate to marked stenosis throughout the left vertebral artery.  Mild irregularity right subclavian artery and right vertebral artery.   Ct Abdomen Pelvis W Contrast 02/11/2012   Findings: Lung bases show no acute findings.  Heart size normal. No pericardial or pleural effusion.  A sub centimeter low attenuation lesion in the right hepatic lobe is too small to characterize.  Liver, gallbladder and right adrenal gland are otherwise unremarkable.  There may be mild  nodularity/thickening involving the left adrenal gland.  Right kidney is unremarkable.  A 1 cm low attenuation lesion in the upper pole left kidney is too small to characterize.  Statistically, this likely represents a cyst.  Spleen, pancreas, stomach and small bowel are unremarkable.  A fair amount of stool is seen in the colon.  There is aortobi-iliac graft repair of the aorta, which is atherosclerotic.  No pathologically enlarged lymph nodes.  No free fluid.  No worrisome lytic or sclerotic lesions.  Prominent Schmorl's nodes are seen along the inferior endplates of L4 and L5.  IMPRESSION: No acute findings.  No findings to explain the patient's given symptoms.  Original Report Authenticated By: Reyes Ivan, M.D.        Assessment  Pt is a 78 y/o female with significant PMHx for vascular dementia with underlying Alzheimer's, dizziness admitted for overnight observation to r/o CVA, acute MI, and acute abdomin.    Plan:  1.  Dizziness  -Pt has significant Hx for underlying vascular dementia for around 6-8 months now.  Does have underlying pathology for Alzheimer's disease  as well.    -Does have evidence of carotid stenosis as noted in MRA of neck with R > L carotid.  Will be going for carotid duplex today for further evaluation.  At this time, not a good surgical candidate for endarectomy.   -PT states the patient needs continued PT services at ALF called [facility].    -Will be getting orthostatic vitals this AM to assess for possible orthostatic hypotension due to multiple BP medications  -May be anxiety related as pt does become dizzy while laying flat or standing up.  Not always related to orthostatics but will wait for orthostaticics as well.  2. L sided pain   -Cardiology believes this is NOT cardiac in origin.  First set of tropinins was negative along with no evidence of acute MI on EKG.    -Abdominal CT does not show evidence of abdominal origin for L sided pain as well   3.   HTN -   -On Inderal 60 mg ER as outpt   -Assessed for Othrostatic hypotension as pt BP did drop >20 on two separate occasions  4. Vascular Dementia and Alzheimer's Disease - Continue home medications at this time  5. Peripheral Neuropathy- Continue Neurontin 6. HA-Most likely secondarily to Propanolol and Topimax.  Could be contributing to her dizziness however.  Will consider stopping the topimax before d/c.   FEN/GI: Regular diet Prophylaxis:  Sub Q Heparin 5,000 U TID Disposition: Pt was admitted for observation and to r/o acute MI, acute abdomen, and CVA.  No evidence on studies at this time for any of the above.  Once Carotid duplex done, can possibly be d/c today and f/u as outpt.  Twana First Gerldine Suleiman DO, FMTS PGY-1

## 2012-02-12 NOTE — Progress Notes (Signed)
Clinical Child psychotherapist (CSW) informed pt ready for dc back to [facility]. CSW confirmed with Karen Kitchens [phone] at facility and confirmed pt okay to return today. CSW prepared and placed dc packet in pt wall a roo, and informed pt son of transport/dc today. CSW provided RN with facility contact number to facility/PTAR after hours and informed that per facility pt okay to transfer anytime after exam today. CSW signing off.  Theresia Bough, MSW, Theresia Majors [phone]

## 2012-02-12 NOTE — Evaluation (Addendum)
Physical Therapy Evaluation Patient Details Name: Sarah Alvarado MRN: [id number] DOB: 03-Dec-1931 Today's Date: 02/12/2012 Time: 1027-2536 PT Time Calculation (min): 34 min  PT Assessment / Plan / Recommendation Clinical Impression  Patient presents with dizziness as primary complaint. She describes dizziness that occurs intermittently and states that it feels like she is spinning inside her head. Patient was very tearful and anxious today with elevated BP's that are likely contributing to her dizziness. Vestibular testing was negative - smooth pursuits,saccades, modified dix hallpike, and VOR. Symptoms noted today with sitting and standing and patient actively  Moving her body in a  circular motion until PT prevented. We will follow to progress her mobility and ensure her safety with gait.     PT Assessment  Patient needs continued PT services    Follow Up Recommendations  Home health PT;Supervision/Assistance - 24 hour    Barriers to Discharge  None      Equipment Recommendations  Rolling walker with 5" wheels    Recommendations for Other Services  None  Frequency Min 3X/week    Precautions / Restrictions Precautions Precautions: Fall   Pertinent Vitals/Pain Elevated BP's 186/61-191/72. Unable to get true orthostatics secondary to patients anxiety affecting BP's.      Mobility  Bed Mobility Bed Mobility: Supine to Sit;Sitting - Scoot to Edge of Bed;Sit to Supine Supine to Sit: 6: Modified independent (Device/Increase time) Sitting - Scoot to Edge of Bed: 6: Modified independent (Device/Increase time) Sit to Supine: 6: Modified independent (Device/Increase time) Details for Bed Mobility Assistance: Increased time only. Transfers Transfers: Sit to Stand;Stand to Sit Sit to Stand: 5: Supervision;With upper extremity assist;From bed Stand to Sit: 5: Supervision;With upper extremity assist;To chair/3-in-1;To bed Details for Transfer Assistance: Patient stood several times from  various surfaces without assistance Ambulation/Gait Ambulation/Gait Assistance: 4: Min guard Ambulation Distance (Feet): 150 Feet Assistive device: Rolling walker Ambulation/Gait Assistance Details: Initial education in safe use of device. Slower speed only Gait Pattern: Within Functional Limits     PT Diagnosis: Difficulty walking  PT Problem List: Decreased activity tolerance;Decreased mobility;Decreased knowledge of use of DME PT Treatment Interventions: DME instruction;Gait training;Therapeutic activities;Therapeutic exercise;Balance training;Patient/family education   PT Goals Acute Rehab PT Goals PT Goal Formulation: With patient Time For Goal Achievement: 02/19/12 Potential to Achieve Goals: Good Pt will go Sit to Stand: with modified independence;with upper extremity assist PT Goal: Sit to Stand - Progress: Goal set today Pt will go Stand to Sit: with modified independence;with upper extremity assist PT Goal: Stand to Sit - Progress: Goal set today Pt will Ambulate: >150 feet;with modified independence;with least restrictive assistive device PT Goal: Ambulate - Progress: Goal set today  Visit Information  Last PT Received On: 02/12/12 Assistance Needed: +1    Subjective Data  Subjective: Patient very tearful. States she is sad about her lack of memories and "hopes sometimes that her life would just end" Patient Stated Goal: Feel happier   Prior Functioning  Home Living Lives With: Alone (at ALF) Available Help at Discharge: Available 24 hours/day Type of Home: Assisted living Home Access: Level entry Home Layout: One level Bathroom Shower/Tub: Engineer, manufacturing systems: Standard Additional Comments: Patient unable to state the equipment that she has. Prior Function Level of Independence: Needs assistance Needs Assistance: Light Housekeeping;Meal Prep Meal Prep: Total Light Housekeeping: Total Driving: No Vocation: Retired Musician:  Clinical cytogeneticist  Overall Cognitive Status: History of cognitive impairments - at baseline Arousal/Alertness: Awake/alert Orientation Level: Disoriented to;Time Behavior During  Session:  (very tearful)    Extremity/Trunk Assessment Right Lower Extremity Assessment RLE ROM/Strength/Tone: Within functional levels RLE Sensation: WFL - Light Touch;WFL - Proprioception RLE Coordination: WFL - gross/fine motor Left Lower Extremity Assessment LLE ROM/Strength/Tone: Within functional levels LLE Sensation: WFL - Light Touch;WFL - Proprioception LLE Coordination: WFL - gross/fine motor Trunk Assessment Trunk Assessment: Normal   Balance  No evidence of imbalance with gait.  End of Session PT - End of Session Equipment Utilized During Treatment: Gait belt Activity Tolerance: Patient tolerated treatment well Patient left: in chair;with call bell/phone within reach;with chair alarm set Nurse Communication: Mobility status   Edwyna Perfect, PT  Pager [phone]  02/12/2012, 9:45 AM

## 2012-02-12 NOTE — H&P (Signed)
Family Medicine Teaching Service Attending Note  I interviewed and examined patient Sarah Alvarado and reviewed their tests and x-rays.  I discussed with Dr. Louanne Belton and reviewed their note for today.  I agree with their assessment and plan.     Additionally  This am is oriented x 1 Emotionally labile - happy conversant one moment and tearful next Able to stand and walk with walker and encouragement Denies dizzyness  Orthostatic changes - would decrease propranolol and tolerate elevated systolic pressures given high risk of fall Possible carotid stenosis - would be asymptomatic since dizzyness is usually posterior circulation - very low likely benefit to stenting or endarterectomy given her comorbidities and dementia.   Would not recommend further study or intervention.

## 2012-02-12 NOTE — Progress Notes (Signed)
Cardiology Progress Note Patient Name: Sarah Alvarado Date of Encounter: 02/12/2012, 9:22 AM     Subjective  No overnight events. Denies chest pain or sob. Reports back pain. Nurse noted patient has been tearful and anxious.   Objective   Telemetry: Sinus rhythm 60-80s  Medications: . aspirin  81 mg Oral Daily  . docusate sodium  250 mg Oral Daily  . donepezil  10 mg Oral QHS  . DULoxetine  60 mg Oral Daily  . gabapentin  300 mg Oral TID  . heparin  5,000 Units Subcutaneous Q8H  . iohexol  20 mL Oral Q1 Hr x 2  . propranolol ER  60 mg Oral Daily  . QUEtiapine  25 mg Oral BID  . sodium chloride  3 mL Intravenous Q12H  . topiramate  150 mg Oral Daily      Physical Exam: Temp:  [97.5 F (36.4 C)-98.1 F (36.7 C)] 97.8 F (36.6 C) (07/02 0800) Pulse Rate:  [63-83] 72  (07/02 0800) Resp:  [14-18] 18  (07/02 0800) BP: (146-192)/(52-96) 180/77 mmHg (07/02 0800) SpO2:  [96 %-100 %] 99 % (07/02 0800)  General: Thin elderly white female, in no acute distress. Head: Normocephalic, atraumatic, sclera non-icteric, nares are without discharge.  Neck: Supple. Bilat carotid bruits. No JVD Lungs: Clear bilaterally to auscultation without wheezes, rales, or rhonchi. Breathing is unlabored. Heart: RRR S1 S2. 2/6 systolic murmur LSB. No rubs or gallops.  Abdomen: Soft, non-tender, non-distended with normoactive bowel sounds. No rebound/guarding. No obvious abdominal masses. Msk:  Strength and tone appear normal for age. Extremities: No edema. No clubbing or cyanosis. Distal pedal pulses are intact and equal bilaterally. Neuro: Alert and oriented x3. Moves all extremities spontaneously. Psych:  Responds to questions appropriately with a anxious affect.  No intake or output data in the 24 hours ending 02/12/12 0922  Labs:  [facility] 02/11/12 1150  NA 144  K 5.1  CL 109  CO2 26  GLUCOSE 95  BUN 20  CREATININE 0.95  CALCIUM 9.7   Basename 02/11/12 1150  AST 17  ALT 8    ALKPHOS 72  BILITOT 0.4  PROT 6.6  ALBUMIN 3.4*   Basename 02/11/12 1150  LIPASE 154*   Basename 02/11/12 1150  WBC 8.8  NEUTROABS 4.8  HGB 12.6  HCT 38.9  MCV 97.5  PLT 253   Basename 02/12/12 0515 02/11/12 1154  CKTOTAL 83 77  CKMB 3.1 3.2  TROPONINI <0.30 <0.30   Radiology/Studies:   02/11/2012 - Chest 2 View Findings: The cardiac silhouette, mediastinal and hilar contours are within normal limits.  The lungs are clear of acute findings. Mild hyperinflation is noted.  There is a rounded density in the left upper lobe which is most likely a calcified granuloma. Comparison with prior studies would be helpful for confirmation. If none are available a follow-up examination in 4-6 months is recommended.  The bony thorax is intact.  Remote healed right-sided rib fractures are noted.  IMPRESSION:  1.  No acute cardiopulmonary findings. 2.  Probable left upper lobe calcified granuloma as discussed above.    02/11/2012 - Ct Head Wo Contrast  Findings: No skull fracture is noted.  Paranasal sinuses shows nodular mucosal thickening left maxillary sinus probable mucous retention cysts the largest measures 1 cm.  No  intracranial hemorrhage, mass effect or midline shift.  Mild cerebral atrophy.  Periventricular and subcortical white matter decreased attenuation is probable due to chronic small vessel ischemic changes.  No acute infarction.  No mass lesion is noted on this unenhanced scan.  IMPRESSION:  No acute intracranial abnormality.  Mild cerebral atrophy. Periventricular and subcortical white matter decreased attenuation probable due to chronic small vessel ischemic changes. Probable mucous retention cysts left maxillary sinus the largest measures 1 cm.     02/11/2012  MRI HEAD   Findings: Motion degraded exam.  No acute infarct.  No intracranial hemorrhage.  Moderate small vessel disease type changes.  Global atrophy without hydrocephalus.  No intracranial mass or abnormal enhancement.   Polypoid opacification left maxillary sinus.  Minimal mucosal thickening ethmoid sinus air cells.  IMPRESSION: No acute infarct.  Please see above.   MRA HEAD   Findings:3.2 mm aneurysm medial aspect left internal carotid artery cavernous segment.  1.6 mm aneurysm medial aspect right internal carotid artery cavernous segment.  Anterior circulation without medium or large size vessel significant stenosis or occlusion.  Mild to moderate middle cerebral artery branch vessel irregularity.  Abnormal appearance of the left vertebral artery which appears markedly narrowed.  Mild narrowing distal right vertebral artery.  Poor delineation of the PICAs.  Moderate to slightly marked narrowing proximal to mid basilar artery.  Irregular narrowed AICAs.  Fetal type origin of the right posterior cerebral artery.  Posterior cerebral artery branch vessel irregularity bilaterally.  Poor delineation superior cerebellar arteries.  IMPRESSION: 3.2 mm aneurysm medial aspect left internal carotid artery cavernous segment.  1.6 mm aneurysm medial aspect right internal carotid artery cavernous segment.  Anterior circulation without medium or large size vessel significant stenosis or occlusion.  Mild to moderate middle cerebral artery branch vessel irregularity.  Abnormal appearance of the left vertebral artery which appears markedly narrowed.  Poor delineation of the PICAs.  Moderate to slightly marked narrowing proximal to mid basilar artery.  Posterior cerebral artery branch vessel irregularity bilaterally.  Poor delineation superior cerebellar arteries.   MRA NECK   Findings:Evaluation limited by motion and phase of contrast administration.  Mild to moderate narrowing proximal left common carotid artery.  Narrowing at the carotid bifurcation bilaterally.  Artifact may partially contribute to this appearance.  Accurate assessment of degree of stenosis of the proximal internal carotid artery is limited.  Findings are suggestive of at  least 60 - 70% diameter stenosis of the proximal right internal carotid artery and 50-60% diameter stenosis proximal left internal carotid artery.  If further delineation is clinically desired, correlation with ultrasound or CT angiogram may be considered.  Moderate to marked tandem stenosis proximal left subclavian artery.  Multiple moderate to marked stenosis throughout the left vertebral artery.  Mild irregularity right subclavian artery and right vertebral artery.  IMPRESSION: Evaluation limited by motion and phase of contrast administration.  Normal configuration of the origin of the great vessels from the aortic arch.  Mild to moderate narrowing proximal left common carotid artery.  Narrowing at the carotid bifurcation bilaterally.  Artifact may partially contribute to this appearance.  Accurate assessment of degree of stenosis of the proximal internal carotid artery is limited.  Findings are suggestive of at least 60 - 70% diameter stenosis of the proximal right internal carotid artery and 50-60% diameter stenosis proximal left internal carotid artery.  If further delineation is clinically desired, correlation with ultrasound or CT angiogram may be considered.  Moderate to marked tandem stenosis proximal left subclavian artery.  Multiple moderate to marked stenosis throughout the left vertebral artery.  Mild irregularity right subclavian artery and right vertebral artery.    02/11/2012 - Ct  Abdomen Pelvis W Contrast Findings: Lung bases show no acute findings.  Heart size normal. No pericardial or pleural effusion.  A sub centimeter low attenuation lesion in the right hepatic lobe is too small to characterize.  Liver, gallbladder and right adrenal gland are otherwise unremarkable.  There may be mild nodularity/thickening involving the left adrenal gland.  Right kidney is unremarkable.  A 1 cm low attenuation lesion in the upper pole left kidney is too small to characterize.  Statistically, this likely  represents a cyst.  Spleen, pancreas, stomach and small bowel are unremarkable.  A fair amount of stool is seen in the colon.  There is aortobi-iliac graft repair of the aorta, which is atherosclerotic.  No pathologically enlarged lymph nodes.  No free fluid.  No worrisome lytic or sclerotic lesions.  Prominent Schmorl's nodes are seen along the inferior endplates of L4 and L5.  IMPRESSION: No acute findings.  No findings to explain the patient's given symptoms.       Assessment and Plan  78 y.o. female w/ PMHx significant for Dementia and HTN who presented to [facility] on 02/11/12 with complaints of dizziness and left sided pain.  1. Abnormal EKG: Admission EKG showed sinus rhythm, incomplete RBBB, possible IWMI, nonspecific T wave changes, mildly prolonged QTc. No changes that are concerning for acute ischemia or that would explain dizziness. Cardiac enzymes normal x2. Telemetry reveals NSR 60-80s, no arrhythmias or significant pauses/bradycardia.    2. Dizziness: CT head without acute intracranial abnls. (+) bilat carotid stenosis on MRA of the neck R>L. Carotid doppler pending. PT/OT to assess for signs of vertigo. (+) orthostatic vitals last night SBP 192-->165 and this morning 125-85.   3. HTN: SBPs 120s-190s. Orthostatic hypotension noted. Nurse noted that her elevated BPs are mainly with episodes of tearfulness and anxiousness. Currently receiving propranolol ER 60mg  daily.    Signed, Hamdi Vari PA-C

## 2012-02-12 NOTE — Discharge Summary (Signed)
Physician Discharge Summary  Patient ID: Sarah Alvarado MRN: [id number] DOB: 1931/12/28 Age: 78 y.o.  Admit date: 02/11/2012 Discharge date: 02/12/2012  PCP: [facility] physician Consultants:[facility]       Discharge Diagnosis: Principal Problem:  *Dizzy Active Problems:  Fall  Hypertension  Dementia  Peripheral neuropathy  Depression    Hospital Course Pt is a 78 y/o female who presented to the hospital with L side pain along with dizziness.  1)Dizziness - Pt was admitted for possibly having an episode of falling.  She had a CT w/o contrast showing no acute infarcts along with an MRA of the brain and neck showing carotid stenosis R > L.  She was tested for orthostatic hypotension which showed a drop in her systolic BP >20 x 2 separate occasions.  She was also noted to have dizzy spells when becoming anxious as well.  Cardiology was consulted and did not believe her episodes were related to her heart but could be a combination of orthostatic hypotension, carotid stenosis, and anxiety.  She also had a carotid doppler done to further evaluate her carotid stenosis and is still pending. Her propranolol was changed to the immediate release so as to decrease her total dose to 30mg  daily.  2)Left Sided Pain- Pt presented to the ED describing pain in her left rib cage.  She was evaluated by cardiology to determine that it was not cardiac in origin.  A CT of her abdomen was also done not showing a bleed or inflammation.  Pt did not c/o pain in this area before discharge  3)Hypertension- Pt had a fluctuation in BP during her visit along with episodes of orthostatic hypotension.  She was discharged on immediate release propranolol 10mg  TID in an effort to decrease fall risk.    4)Dementia- Pt has a history of vascular dementia x 6-8 months along with underlying Alzheimer's disease.  A CT and MRI of her brain did not show an acute CVA.  She was continued on her home  medications of Aricept and Seroquel  5)Peripheral Neuropathy- Stable. Continued on her neurontin  6) Depression- Pt presented with emotional distress being extremely upset that her memory has faded.  She was continued on Cymbalta.  No changes were made to her medications in the hospital visit.   Discharge Exam: Temp: [97.5 F (36.4 C)-98.1 F (36.7 C)] 97.8 F (36.6 C) (07/02 0800)  Pulse Rate: [63-83] 72 (07/02 0800)  Resp: [14-18] 18 (07/02 0800)  BP: (146-192)/(52-96) 180/77 mmHg (07/02 0800)  SpO2: [96 %-100 %] 99 % (07/02 0800)  Gen: Moderate emotional distress  HEENT: moist mucous membranes  Neck: + Bruits B/L carotids  CV: Regular rate and rhythm, +2 systolic murmur RUSB no rubs or gallops  PULM: clear to auscultation bilaterally. No wheezes/rales/rhonchi  ABD: soft/nontender/nondistended/normal bowel sounds  EXT: No edema  Neuro: Alert, awake, but slightly confused about where she is.    Procedures/Imaging:  Dg Chest 2 View   02/11/2012    IMPRESSION:  1.  No acute cardiopulmonary findings. 2.  Probable left upper lobe calcified granuloma as discussed above.  Original Report Authenticated By: P. Loralie Champagne, M.D.   Ct Head Wo Contrast  02/11/2012  IMPRESSION:  No acute intracranial abnormality.  Mild cerebral atrophy. Periventricular and subcortical white matter decreased attenuation probable due to chronic small vessel ischemic changes. Probable mucous retention cysts left maxillary sinus the largest measures 1 cm.  Original Report Authenticated By: Natasha Mead, M.D.   Mr [facility]  Wo Contrast 02/11/2012  IMPRESSION: No acute infarct.    MRA HEAD  Findings:3.2 mm aneurysm medial aspect left internal carotid artery cavernous segment.  1.6 mm aneurysm medial aspect right internal carotid artery cavernous segment.  Anterior circulation without medium or large size vessel significant stenosis or occlusion.  Mild to moderate middle cerebral artery branch vessel irregularity.   Abnormal appearance of the left vertebral artery which appears markedly narrowed.  Mild narrowing distal right vertebral artery.  Poor delineation of the PICAs.  Moderate to slightly marked narrowing proximal to mid basilar artery.  Irregular narrowed AICAs.  Fetal type origin of the right posterior cerebral artery.  Posterior cerebral artery branch vessel irregularity bilaterally.  Poor delineation superior cerebellar arteries.  IMPRESSION: 3.2 mm aneurysm medial aspect left internal carotid artery cavernous segment.  1.6 mm aneurysm medial aspect right internal carotid artery cavernous segment.  Anterior circulation without medium or large size vessel significant stenosis or occlusion.  Mild to moderate middle cerebral artery branch vessel irregularity.  Abnormal appearance of the left vertebral artery which appears markedly narrowed.  Poor delineation of the PICAs.  Moderate to slightly marked narrowing proximal to mid basilar artery.  Posterior cerebral artery branch vessel irregularity bilaterally.  Poor delineation superior cerebellar arteries.    MRA NECK  Findings:Evaluation limited by motion and phase of contrast administration.  Mild to moderate narrowing proximal left common carotid artery.  Narrowing at the carotid bifurcation bilaterally.  Artifact may partially contribute to this appearance.  Accurate assessment of degree of stenosis of the proximal internal carotid artery is limited.  Findings are suggestive of at least 60 - 70% diameter stenosis of the proximal right internal carotid artery and 50-60% diameter stenosis proximal left internal carotid artery.  If further delineation is clinically desired, correlation with ultrasound or CT angiogram may be considered.  Moderate to marked tandem stenosis proximal left subclavian artery.  Multiple moderate to marked stenosis throughout the left vertebral artery.  Mild irregularity right subclavian artery and right vertebral artery.      Ct Abdomen  Pelvis W Contrast 02/11/2012  * IMPRESSION: No acute findings.  No findings to explain the patient's given symptoms.  Original Report Authenticated By: Reyes Ivan, M.D.    Labs  CBC  Lab 02/11/12 1150  WBC 8.8  HGB 12.6  HCT 38.9  PLT 253   BMET  Lab 02/11/12 1150  NA 144  K 5.1  CL 109  CO2 26  BUN 20  CREATININE 0.95  CALCIUM 9.7  PROT 6.6  BILITOT 0.4  ALKPHOS 72  ALT 8  AST 17  GLUCOSE 95   Results for orders placed during the hospital encounter of 02/11/12 (from the past 72 hour(s))  LACTIC ACID, PLASMA     Status: Normal   Collection Time   02/11/12 11:41 AM      Component Value Range Comment   Lactic Acid, Venous 2.1  0.5 - 2.2 mmol/L   CBC WITH DIFFERENTIAL     Status: Normal   Collection Time   02/11/12 11:50 AM      Component Value Range Comment   WBC 8.8  4.0 - 10.5 K/uL    RBC 3.99  3.87 - 5.11 MIL/uL    Hemoglobin 12.6  12.0 - 15.0 g/dL    HCT 16.1  09.6 - 04.5 %    MCV 97.5  78.0 - 100.0 fL    MCH 31.6  26.0 - 34.0 pg    MCHC 32.4  30.0 - 36.0 g/dL    RDW 96.0  45.4 - 09.8 %    Platelets 253  150 - 400 K/uL    Neutrophils Relative 55  43 - 77 %    Neutro Abs 4.8  1.7 - 7.7 K/uL    Lymphocytes Relative 34  12 - 46 %    Lymphs Abs 2.9  0.7 - 4.0 K/uL    Monocytes Relative 8  3 - 12 %    Monocytes Absolute 0.7  0.1 - 1.0 K/uL    Eosinophils Relative 3  0 - 5 %    Eosinophils Absolute 0.3  0.0 - 0.7 K/uL    Basophils Relative 0  0 - 1 %    Basophils Absolute 0.0  0.0 - 0.1 K/uL   COMPREHENSIVE METABOLIC PANEL     Status: Abnormal   Collection Time   02/11/12 11:50 AM      Component Value Range Comment   Sodium 144  135 - 145 mEq/L    Potassium 5.1  3.5 - 5.1 mEq/L    Chloride 109  96 - 112 mEq/L    CO2 26  19 - 32 mEq/L    Glucose, Bld 95  70 - 99 mg/dL    BUN 20  6 - 23 mg/dL    Creatinine, Ser 1.19  0.50 - 1.10 mg/dL    Calcium 9.7  8.4 - 14.7 mg/dL    Total Protein 6.6  6.0 - 8.3 g/dL    Albumin 3.4 (*) 3.5 - 5.2 g/dL    AST 17  0 -  37 U/L    ALT 8  0 - 35 U/L    Alkaline Phosphatase 72  39 - 117 U/L    Total Bilirubin 0.4  0.3 - 1.2 mg/dL    GFR calc non Af Amer 56 (*) >90 mL/min    GFR calc Af Amer 65 (*) >90 mL/min   PROTIME-INR     Status: Normal   Collection Time   02/11/12 11:50 AM      Component Value Range Comment   Prothrombin Time 14.4  11.6 - 15.2 seconds    INR 1.10  0.00 - 1.49   LIPASE, BLOOD     Status: Abnormal   Collection Time   02/11/12 11:50 AM      Component Value Range Comment   Lipase 154 (*) 11 - 59 U/L   CARDIAC PANEL(CRET KIN+CKTOT+MB+TROPI)     Status: Normal   Collection Time   02/11/12 11:54 AM      Component Value Range Comment   Total CK 77  7 - 177 U/L    CK, MB 3.2  0.3 - 4.0 ng/mL    Troponin I <0.30  <0.30 ng/mL    Relative Index RELATIVE INDEX IS INVALID  0.0 - 2.5   URINALYSIS, ROUTINE W REFLEX MICROSCOPIC     Status: Normal   Collection Time   02/11/12 12:05 PM      Component Value Range Comment   Color, Urine YELLOW  YELLOW    APPearance CLEAR  CLEAR    Specific Gravity, Urine 1.014  1.005 - 1.030    pH 7.5  5.0 - 8.0    Glucose, UA NEGATIVE  NEGATIVE mg/dL    Hgb urine dipstick NEGATIVE  NEGATIVE    Bilirubin Urine NEGATIVE  NEGATIVE    Ketones, ur NEGATIVE  NEGATIVE mg/dL    Protein, ur NEGATIVE  NEGATIVE mg/dL    Urobilinogen, UA  1.0  0.0 - 1.0 mg/dL    Nitrite NEGATIVE  NEGATIVE    Leukocytes, UA NEGATIVE  NEGATIVE MICROSCOPIC NOT DONE ON URINES WITH NEGATIVE PROTEIN, BLOOD, LEUKOCYTES, NITRITE, OR GLUCOSE <1000 mg/dL.  MRSA PCR SCREENING     Status: Normal   Collection Time   02/11/12 11:42 PM      Component Value Range Comment   MRSA by PCR NEGATIVE  NEGATIVE   CARDIAC PANEL(CRET KIN+CKTOT+MB+TROPI)     Status: Normal   Collection Time   02/12/12  5:15 AM      Component Value Range Comment   Total CK 83  7 - 177 U/L    CK, MB 3.1  0.3 - 4.0 ng/mL    Troponin I <0.30  <0.30 ng/mL    Relative Index RELATIVE INDEX IS INVALID  0.0 - 2.5   VITAMIN B12     Status:  Normal   Collection Time   02/12/12  5:15 AM      Component Value Range Comment   Vitamin B-12 440  211 - 911 pg/mL        Patient condition at time of discharge/disposition: Stable Disposition-[facility] with PT and OT services   Follow up issues: 1. Please follow the patient for episodes of dizziness.  They may be related to her anxiety and orthostatic hypotension.  Her Carotid Stenosis may need to be managed medically since she is not a good surgical candidate.   2. Please follow her with her depression.  She may need a change in medication. 3. Please follow her hypertension.  She had variable ranges while in the hospital and may need closer monitoring. 4. Final read on carotid dopplers were not done prior to discharge; will send to [facility] when available. Discharge follow up:   Follow-up Information    Follow up with [facility].        Discharge Orders    Future Orders Please Complete By Expires   Diet - low sodium heart healthy      Increase activity slowly      Call MD for:  persistant dizziness or light-headedness      Call MD for:  temperature >100.4        Discharge Medications Medication List  As of 02/12/2012  2:46 PM   STOP taking these medications         propranolol ER 60 MG 24 hr capsule         TAKE these medications         alum & mag hydroxide-simeth 200-200-20 MG/5ML suspension   Commonly known as: MAALOX/MYLANTA   Take 15 mLs by mouth every 6 (six) hours as needed. For indigestion      aspirin 81 MG chewable tablet   Chew 81 mg by mouth daily.      Cranberry 475 MG Caps   Take 1 capsule by mouth 2 (two) times daily.      docusate sodium 250 MG capsule   Commonly known as: COLACE   Take 250 mg by mouth daily.      donepezil 10 MG tablet   Commonly known as: ARICEPT   Take 10 mg by mouth at bedtime.      DULoxetine 60 MG capsule   Commonly known as: CYMBALTA   Take 60 mg by mouth daily.       fluocinolone 0.01 % cream   Commonly known as: VANOS   Apply 1 application topically 2 (two) times daily  as needed. To affected areas      gabapentin 300 MG capsule   Commonly known as: NEURONTIN   Take 300 mg by mouth 3 (three) times daily.      magnesium hydroxide 400 MG/5ML suspension   Commonly known as: MILK OF MAGNESIA   Take 15 mLs by mouth daily as needed. For constipation      propranolol 10 MG tablet   Commonly known as: INDERAL   Take 1 tablet (10 mg total) by mouth 3 (three) times daily.      QUEtiapine 25 MG tablet   Commonly known as: SEROQUEL   Take 25 mg by mouth 2 (two) times daily.      SUMAtriptan 50 MG tablet   Commonly known as: IMITREX   Take 50 mg by mouth every 2 (two) hours as needed. For headache      topiramate 50 MG tablet   Commonly known as: TOPAMAX   Take 150 mg by mouth daily.      traMADol 50 MG tablet   Commonly known as: ULTRAM   Take 50 mg by mouth 4 (four) times daily as needed. For pain           Gildardo Cranker, DO of [facility] 02/12/2012 2:45 PM

## 2012-02-12 NOTE — Progress Notes (Signed)
Patient's oxygen saturations dropping to the mid 80's while sleeping. Patient resting comfortably. Pulse ox readjusted. Patient displaying some episodes of apneic breathing. Patient placed on 2.5 L N/C. Will continue to monitor patient.

## 2012-02-13 NOTE — Discharge Summary (Signed)
FMTS Attending Note  Patient case discussed with resident team, I agree with the discharge plan.  Paula Compton, MD

## 2012-02-13 NOTE — Progress Notes (Signed)
FMTS Attending Note Patient case discussed with resident team, as well as admitting attending Dr Deirdre Priest.  I agree with plan as presented.  Paula Compton, MD

## 2012-03-12 NOTE — Progress Notes (Addendum)
PT Late Entry on 03/12/12 1124 for 02-24-2012:  Reviewed medical record including [facility] PT evaluation on 24-Feb-2012 to determine G-Codes   2012-02-24 0945  PT G-Codes **NOT FOR INPATIENT CLASS**  Functional Assessment Tool Used Clinical judgement based on medical record review  Functional Limitation Mobility: Walking and moving around  Mobility: Walking and Moving Around Current Status [phone]) CJ  Mobility: Walking and Moving Around Goal Status (G2952) [facility]  PT General Charges  $$ ACUTE PT VISIT 1 Procedure  PT Evaluation  $Initial PT Evaluation Tier II 1 Procedure    Lavona Mound, PT  [phone] 03/12/2012

## 2012-03-12 NOTE — Progress Notes (Signed)
Late entry based on Glendale Chard, OTR/L evaluation on 03-13-12.  03-13-12 1400  OT G-codes **NOT FOR INPATIENT CLASS**  Functional Assessment Tool Used Clinical judgement based off of chart review  Functional Limitation Self care  Self Care Current Status [phone]) CI  Self Care Goal Status (U0454) CH  OT General Charges  $OT Visit 1 Procedure  OT Evaluation  $Initial OT Evaluation Tier II 1 Procedure  OT Treatments  $Self Care/Home Management  8-22 mins   Ignacia Palma, OTR/L [phone] 03/12/2012

## 2012-07-01 ENCOUNTER — Ambulatory Visit: Payer: Medicare Other | Attending: Geriatric Medicine | Admitting: Audiology

## 2012-07-01 DIAGNOSIS — H905 Unspecified sensorineural hearing loss: Secondary | ICD-10-CM | POA: Insufficient documentation

## 2012-07-07 ENCOUNTER — Ambulatory Visit: Payer: Medicare Other | Admitting: Audiology

## 2013-02-15 ENCOUNTER — Emergency Department (HOSPITAL_BASED_OUTPATIENT_CLINIC_OR_DEPARTMENT_OTHER): Payer: Medicare Other

## 2013-02-15 ENCOUNTER — Emergency Department (HOSPITAL_BASED_OUTPATIENT_CLINIC_OR_DEPARTMENT_OTHER)
Admission: EM | Admit: 2013-02-15 | Discharge: 2013-02-15 | Disposition: A | Discharge status: Skilled Nursing Facility | Payer: Medicare Other | Attending: Emergency Medicine | Admitting: Emergency Medicine

## 2013-02-15 ENCOUNTER — Encounter (HOSPITAL_BASED_OUTPATIENT_CLINIC_OR_DEPARTMENT_OTHER): Payer: Self-pay

## 2013-02-15 DIAGNOSIS — Z8679 Personal history of other diseases of the circulatory system: Secondary | ICD-10-CM | POA: Insufficient documentation

## 2013-02-15 DIAGNOSIS — F0390 Unspecified dementia without behavioral disturbance: Secondary | ICD-10-CM | POA: Insufficient documentation

## 2013-02-15 DIAGNOSIS — G8929 Other chronic pain: Secondary | ICD-10-CM | POA: Insufficient documentation

## 2013-02-15 DIAGNOSIS — Y9210 Unspecified residential institution as the place of occurrence of the external cause: Secondary | ICD-10-CM | POA: Insufficient documentation

## 2013-02-15 DIAGNOSIS — M545 Low back pain, unspecified: Secondary | ICD-10-CM | POA: Insufficient documentation

## 2013-02-15 DIAGNOSIS — Z79899 Other long term (current) drug therapy: Secondary | ICD-10-CM | POA: Insufficient documentation

## 2013-02-15 DIAGNOSIS — W19XXXA Unspecified fall, initial encounter: Secondary | ICD-10-CM | POA: Insufficient documentation

## 2013-02-15 DIAGNOSIS — S0993XA Unspecified injury of face, initial encounter: Secondary | ICD-10-CM | POA: Insufficient documentation

## 2013-02-15 DIAGNOSIS — F3289 Other specified depressive episodes: Secondary | ICD-10-CM | POA: Insufficient documentation

## 2013-02-15 DIAGNOSIS — Y939 Activity, unspecified: Secondary | ICD-10-CM | POA: Insufficient documentation

## 2013-02-15 DIAGNOSIS — Z8719 Personal history of other diseases of the digestive system: Secondary | ICD-10-CM | POA: Insufficient documentation

## 2013-02-15 DIAGNOSIS — Z8711 Personal history of peptic ulcer disease: Secondary | ICD-10-CM | POA: Insufficient documentation

## 2013-02-15 DIAGNOSIS — H9190 Unspecified hearing loss, unspecified ear: Secondary | ICD-10-CM | POA: Insufficient documentation

## 2013-02-15 DIAGNOSIS — M25559 Pain in unspecified hip: Secondary | ICD-10-CM | POA: Insufficient documentation

## 2013-02-15 DIAGNOSIS — Z872 Personal history of diseases of the skin and subcutaneous tissue: Secondary | ICD-10-CM | POA: Insufficient documentation

## 2013-02-15 DIAGNOSIS — Z7982 Long term (current) use of aspirin: Secondary | ICD-10-CM | POA: Insufficient documentation

## 2013-02-15 DIAGNOSIS — I10 Essential (primary) hypertension: Secondary | ICD-10-CM | POA: Insufficient documentation

## 2013-02-15 DIAGNOSIS — F329 Major depressive disorder, single episode, unspecified: Secondary | ICD-10-CM | POA: Insufficient documentation

## 2013-02-15 DIAGNOSIS — Z8669 Personal history of other diseases of the nervous system and sense organs: Secondary | ICD-10-CM | POA: Insufficient documentation

## 2013-02-15 HISTORY — DX: Other chronic pain: G89.29

## 2013-02-15 HISTORY — DX: Diverticulitis of intestine, part unspecified, without perforation or abscess without bleeding: K57.92

## 2013-02-15 HISTORY — DX: Allergic rhinitis, unspecified: J30.9

## 2013-02-15 HISTORY — DX: Peripheral vascular disease, unspecified: I73.9

## 2013-02-15 HISTORY — DX: Peptic ulcer, site unspecified, unspecified as acute or chronic, without hemorrhage or perforation: K27.9

## 2013-02-15 HISTORY — DX: Headache, unspecified: R51.9

## 2013-02-15 HISTORY — DX: Unspecified hearing loss, unspecified ear: H91.90

## 2013-02-15 HISTORY — DX: Polyneuropathy, unspecified: G62.9

## 2013-02-15 HISTORY — DX: Irritable bowel syndrome without diarrhea: K58.9

## 2013-02-15 HISTORY — DX: Pain in left hip: M25.552

## 2013-02-15 HISTORY — DX: Headache: R51

## 2013-02-15 HISTORY — DX: Nonrheumatic aortic valve disorder, unspecified: I35.9

## 2013-02-15 HISTORY — DX: Low back pain: M54.5

## 2013-02-15 HISTORY — DX: Tremor, unspecified: R25.1

## 2013-02-15 HISTORY — DX: Low back pain, unspecified: M54.50

## 2013-02-15 HISTORY — DX: Aortic aneurysm of unspecified site, without rupture: I71.9

## 2013-02-15 NOTE — ED Provider Notes (Signed)
History    CSN: [id number] Arrival date & time 02/15/13  1018  First MD Initiated Contact with Patient 02/15/13 1019     Chief Complaint  Patient presents with  . Fall    HPI Per [employer], pt was at [facility] today and was found on the floor, it is uncertain how she got there. Pt was hypertensive upon arrival of EMS, 12 lead performed by EMS unremarkable per medic, states that she has pain but these are chronic pain issues, no new injuries or pain reported. Pt is hard of hearing. Only anticoag taken normally is ASA.   Past Medical History  Diagnosis Date  . Hypertension   . Depression   . Dementia   . Back pain   . Neuropathy     peripheral  . Constipation   . Eczema   . Peptic ulcer   . Peripheral neuropathy   . Aortic aneurysm   . Allergic rhinitis   . IC (irritable colon)   . Hearing loss   . Tremor   . Eczema   . HA (headache)   . Back pain   . Aortic valve disease   . Diverticulitis   . Chronic left hip pain   . Chronic low back pain   . PVD (peripheral vascular disease)    History reviewed. No pertinent past surgical history. History reviewed. No pertinent family history. History  Substance Use Topics  . Smoking status: Unknown If Ever Smoked  . Smokeless tobacco: Not on file  . Alcohol Use: No   OB History   Grav Para Term Preterm Abortions TAB SAB Ect Mult Living                 Review of Systems  Unable to perform ROS: Dementia    Allergies  Review of patient's allergies indicates no known allergies.  Home Medications   Current Outpatient Rx  Name  Route  Sig  Dispense  Refill  . alum & mag hydroxide-simeth (MAALOX/MYLANTA) 200-200-20 MG/5ML suspension   Oral   Take 15 mLs by mouth every 6 (six) hours as needed. For indigestion         . aspirin 81 MG chewable tablet   Oral   Chew 81 mg by mouth daily.         . Atorvastatin Calcium (LIPITOR PO)   Oral   Take 5 mg by mouth at bedtime.         .  Cholecalciferol (VITAMIN D3) 1000 UNITS CAPS   Oral   Take 1 capsule by mouth daily.         . Cranberry 475 MG CAPS   Oral   Take 1 capsule by mouth 2 (two) times daily.         Marland Kitchen docusate sodium (COLACE) 250 MG capsule   Oral   Take 250 mg by mouth daily.         Marland Kitchen donepezil (ARICEPT) 10 MG tablet   Oral   Take 1 tablet (10 mg total) by mouth at bedtime.   30 tablet   0   . DULoxetine (CYMBALTA) 30 MG capsule   Oral   Take 30 mg by mouth daily. Take along with 60mg  capsule by mouth each day for a total of 90mg  by mouth daily.         . DULoxetine (CYMBALTA) 60 MG capsule   Oral   Take 60 mg by mouth daily. Take along with a 30 mg  capsule for a total of 90mg  by mouth daily         . fluocinolone (VANOS) 0.01 % cream   Topical   Apply 1 application topically 2 (two) times daily as needed. To affected areas         . gabapentin (NEURONTIN) 300 MG capsule   Oral   Take 300 mg by mouth 2 (two) times daily.         . magnesium hydroxide (MILK OF MAGNESIA) 400 MG/5ML suspension   Oral   Take 15 mLs by mouth daily as needed. For constipation         . omeprazole (PRILOSEC) 20 MG capsule   Oral   Take 20 mg by mouth daily.         . propranolol (INDERAL) 10 MG tablet   Oral   Take 1 tablet (10 mg total) by mouth 3 (three) times daily.   90 tablet   0   . QUEtiapine (SEROQUEL) 25 MG tablet   Oral   Take 1 tablet (25 mg total) by mouth 2 (two) times daily.   30 tablet   0   . SUMAtriptan (IMITREX) 50 MG tablet   Oral   Take 50 mg by mouth every 2 (two) hours as needed. For headache         . topiramate (TOPAMAX) 50 MG tablet   Oral   Take 50 mg by mouth 3 (three) times daily.         . traMADol (ULTRAM) 50 MG tablet   Oral   Take 1 tablet (50 mg total) by mouth 4 (four) times daily as needed. For pain   30 tablet   0   . triamcinolone lotion (KENALOG) 0.1 %   Topical   Apply 1 application topically daily. Apply to red itchy area every  day directed          BP 227/91  Pulse 75  Temp(Src) 98 F (36.7 C) (Oral)  Resp 18  SpO2 100% Physical Exam  Nursing note and vitals reviewed. Constitutional: She appears well-developed and well-nourished. No distress.  HENT:  Head: Normocephalic and atraumatic.  Eyes: Pupils are equal, round, and reactive to light.  Neck: Normal range of motion. Neck supple. Muscular tenderness present. No spinous process tenderness present.  Cardiovascular: Normal rate and intact distal pulses.   Pulmonary/Chest: No respiratory distress.  Abdominal: Normal appearance. She exhibits no distension. There is no tenderness.  Musculoskeletal: Normal range of motion.  Neurological: She is alert. No cranial nerve deficit. GCS eye subscore is 4. GCS verbal subscore is 5. GCS motor subscore is 6.  Skin: Skin is warm and dry. No rash noted.  Psychiatric: She has a normal mood and affect. Her behavior is normal.    ED Course  Procedures (including critical care time) Labs Reviewed - No data to display Ct Head Wo Contrast  02/15/2013   *RADIOLOGY REPORT*  Clinical Data:  Fall  CT HEAD WITHOUT CONTRAST CT CERVICAL SPINE WITHOUT CONTRAST  Technique:  Multidetector CT imaging of the head and cervical spine was performed following the standard protocol without intravenous contrast.  Multiplanar CT image reconstructions of the cervical spine were also generated.  Comparison:  Brain MRI 02/11/2012; most recent prior head CT also 02/11/2012  CT HEAD  Findings: No acute intracranial hemorrhage, acute infarction, mass lesion, mass effect, midline shift or hydrocephalus.  Gray-white differentiation is preserved throughout.  Moderately advanced cerebral and cerebellar atrophy.  Periventricular, subcortical and deep  white matter hypoattenuation without significant interval progression.  While nonspecific.  Findings are most consistent with sequela of longstanding microvascular disease.  The globes are intact.  The orbits are  unremarkable.  No focal scalp hematoma. Calvarium is intact.  Normal aeration of the mastoid air cells and paranasal sinuses.  Atherosclerosis noted in the bilateral cavernous carotid arteries.  IMPRESSION: No acute intracranial abnormality.  Stable atrophy and chronic ischemic microvascular white matter disease.  CT CERVICAL SPINE  Findings: No acute fracture, malalignment or prevertebral soft tissue swelling.  Biapical right greater than left pleural parenchymal scarring.  Atherosclerotic calcification noted in the bilateral carotid bifurcations.  Unremarkable CT appearance of the thyroid gland.  No focal soft tissue abnormality.  Multilevel degenerative disc disease.  Posterior disc osteophyte complexes at C6-C7 and C7-T1 result in mild central canal narrowing.  There is calcification of the ligamentum flavum at C7-T1 and T1-T2.  IMPRESSION:  1.  No acute fracture or malalignment. 2.  Multilevel cervical spondylosis with mild multi focal central stenosis.   Original Report Authenticated By: Malachy Moan, M.D.   Ct Cervical Spine Wo Contrast  02/15/2013   *RADIOLOGY REPORT*  Clinical Data:  Fall  CT HEAD WITHOUT CONTRAST CT CERVICAL SPINE WITHOUT CONTRAST  Technique:  Multidetector CT imaging of the head and cervical spine was performed following the standard protocol without intravenous contrast.  Multiplanar CT image reconstructions of the cervical spine were also generated.  Comparison:  Brain MRI 02/11/2012; most recent prior head CT also 02/11/2012  CT HEAD  Findings: No acute intracranial hemorrhage, acute infarction, mass lesion, mass effect, midline shift or hydrocephalus.  Gray-white differentiation is preserved throughout.  Moderately advanced cerebral and cerebellar atrophy.  Periventricular, subcortical and deep white matter hypoattenuation without significant interval progression.  While nonspecific.  Findings are most consistent with sequela of longstanding microvascular disease.  The globes  are intact.  The orbits are unremarkable.  No focal scalp hematoma. Calvarium is intact.  Normal aeration of the mastoid air cells and paranasal sinuses.  Atherosclerosis noted in the bilateral cavernous carotid arteries.  IMPRESSION: No acute intracranial abnormality.  Stable atrophy and chronic ischemic microvascular white matter disease.  CT CERVICAL SPINE  Findings: No acute fracture, malalignment or prevertebral soft tissue swelling.  Biapical right greater than left pleural parenchymal scarring.  Atherosclerotic calcification noted in the bilateral carotid bifurcations.  Unremarkable CT appearance of the thyroid gland.  No focal soft tissue abnormality.  Multilevel degenerative disc disease.  Posterior disc osteophyte complexes at C6-C7 and C7-T1 result in mild central canal narrowing.  There is calcification of the ligamentum flavum at C7-T1 and T1-T2.  IMPRESSION:  1.  No acute fracture or malalignment. 2.  Multilevel cervical spondylosis with mild multi focal central stenosis.   Original Report Authenticated By: Malachy Moan, M.D.   1. Fall, initial encounter     MDM    Nelia Shi, MD 02/15/13 1155

## 2013-02-15 NOTE — ED Notes (Signed)
Transport to nursing facility by ptar

## 2013-02-15 NOTE — ED Notes (Signed)
Per [facility] Medic, pt was at [facility] today and was found on the floor, it is uncertain how she got there.  Pt was hypertensive upon arrival of EMS, 12 lead performed by EMS unremarkable per medic, states that she has pain but these are chronic pain issues, no new injuries or pain reported.  Pt is hard of hearing.  Only anticoag taken normally is ASA.

## 2013-03-30 ENCOUNTER — Encounter (HOSPITAL_BASED_OUTPATIENT_CLINIC_OR_DEPARTMENT_OTHER): Payer: Self-pay | Admitting: *Deleted

## 2013-03-30 ENCOUNTER — Emergency Department (HOSPITAL_BASED_OUTPATIENT_CLINIC_OR_DEPARTMENT_OTHER): Payer: Medicare Other

## 2013-03-30 ENCOUNTER — Emergency Department (HOSPITAL_BASED_OUTPATIENT_CLINIC_OR_DEPARTMENT_OTHER)
Admission: EM | Admit: 2013-03-30 | Discharge: 2013-03-30 | Disposition: A | Discharge status: Home / Self Care | Payer: Medicare Other | Attending: Emergency Medicine | Admitting: Emergency Medicine

## 2013-03-30 DIAGNOSIS — H9190 Unspecified hearing loss, unspecified ear: Secondary | ICD-10-CM | POA: Insufficient documentation

## 2013-03-30 DIAGNOSIS — R350 Frequency of micturition: Secondary | ICD-10-CM | POA: Insufficient documentation

## 2013-03-30 DIAGNOSIS — Z8719 Personal history of other diseases of the digestive system: Secondary | ICD-10-CM | POA: Insufficient documentation

## 2013-03-30 DIAGNOSIS — Z7982 Long term (current) use of aspirin: Secondary | ICD-10-CM | POA: Insufficient documentation

## 2013-03-30 DIAGNOSIS — R011 Cardiac murmur, unspecified: Secondary | ICD-10-CM | POA: Insufficient documentation

## 2013-03-30 DIAGNOSIS — Z8679 Personal history of other diseases of the circulatory system: Secondary | ICD-10-CM | POA: Insufficient documentation

## 2013-03-30 DIAGNOSIS — F0390 Unspecified dementia without behavioral disturbance: Secondary | ICD-10-CM | POA: Insufficient documentation

## 2013-03-30 DIAGNOSIS — Y939 Activity, unspecified: Secondary | ICD-10-CM | POA: Insufficient documentation

## 2013-03-30 DIAGNOSIS — W19XXXA Unspecified fall, initial encounter: Secondary | ICD-10-CM

## 2013-03-30 DIAGNOSIS — S3981XA Other specified injuries of abdomen, initial encounter: Secondary | ICD-10-CM | POA: Insufficient documentation

## 2013-03-30 DIAGNOSIS — I739 Peripheral vascular disease, unspecified: Secondary | ICD-10-CM | POA: Insufficient documentation

## 2013-03-30 DIAGNOSIS — Z8711 Personal history of peptic ulcer disease: Secondary | ICD-10-CM | POA: Insufficient documentation

## 2013-03-30 DIAGNOSIS — G8929 Other chronic pain: Secondary | ICD-10-CM | POA: Insufficient documentation

## 2013-03-30 DIAGNOSIS — Z8709 Personal history of other diseases of the respiratory system: Secondary | ICD-10-CM | POA: Insufficient documentation

## 2013-03-30 DIAGNOSIS — I10 Essential (primary) hypertension: Secondary | ICD-10-CM | POA: Insufficient documentation

## 2013-03-30 DIAGNOSIS — R296 Repeated falls: Secondary | ICD-10-CM | POA: Insufficient documentation

## 2013-03-30 DIAGNOSIS — Z79899 Other long term (current) drug therapy: Secondary | ICD-10-CM | POA: Insufficient documentation

## 2013-03-30 DIAGNOSIS — Y9210 Unspecified residential institution as the place of occurrence of the external cause: Secondary | ICD-10-CM | POA: Insufficient documentation

## 2013-03-30 DIAGNOSIS — Z8669 Personal history of other diseases of the nervous system and sense organs: Secondary | ICD-10-CM | POA: Insufficient documentation

## 2013-03-30 DIAGNOSIS — Z872 Personal history of diseases of the skin and subcutaneous tissue: Secondary | ICD-10-CM | POA: Insufficient documentation

## 2013-03-30 LAB — TROPONIN I: Troponin I: <0.3 ng/mL (ref <0.30)

## 2013-03-30 LAB — CBC WITH DIFFERENTIAL/PLATELET
Basophils Relative: 0 % (ref 0–1)
HCT: 37 % (ref 36.0–46.0)
Hemoglobin: 12 g/dL (ref 12.0–15.0)
Lymphs Abs: 3 10*3/uL (ref 0.7–4.0)
MCH: 31.4 pg (ref 26.0–34.0)
MCHC: 32.4 g/dL (ref 30.0–36.0)
Monocytes Absolute: 1.3 10*3/uL — ABNORMAL HIGH (ref 0.1–1.0)
Monocytes Relative: 12 % (ref 3–12)
Neutro Abs: 6 10*3/uL (ref 1.7–7.7)
RBC: 3.82 MIL/uL — ABNORMAL LOW (ref 3.87–5.11)

## 2013-03-30 LAB — COMPREHENSIVE METABOLIC PANEL
Albumin: 3.3 g/dL — ABNORMAL LOW (ref 3.5–5.2)
Alkaline Phosphatase: 84 U/L (ref 39–117)
BUN: 19 mg/dL (ref 6–23)
Chloride: 108 mEq/L (ref 96–112)
Creatinine, Ser: 1.1 mg/dL (ref 0.50–1.10)
GFR calc Af Amer: 54 mL/min — ABNORMAL LOW (ref >90)
GFR calc non Af Amer: 46 mL/min — ABNORMAL LOW (ref >90)
Glucose, Bld: 105 mg/dL — ABNORMAL HIGH (ref 70–99)
Potassium: 3.7 mEq/L (ref 3.5–5.1)
Total Bilirubin: 0.1 mg/dL — ABNORMAL LOW (ref 0.3–1.2)

## 2013-03-30 LAB — URINALYSIS, ROUTINE W REFLEX MICROSCOPIC
Bilirubin Urine: NEGATIVE
Glucose, UA: NEGATIVE mg/dL
Ketones, ur: NEGATIVE mg/dL
Leukocytes, UA: NEGATIVE
Specific Gravity, Urine: 1.012 (ref 1.005–1.030)
pH: 6 (ref 5.0–8.0)

## 2013-03-30 NOTE — ED Notes (Signed)
Call placed to [facility] concerning delay in pt transport back to facility, informed that pt is on the list and will be here ASAP.

## 2013-03-30 NOTE — ED Notes (Signed)
Report provided to cma at [facility], pt currently on bedpan, ptar notified of need for transport back to facility

## 2013-03-30 NOTE — ED Notes (Signed)
PTAR here for pt transport. Pt in NAD upon transfer.

## 2013-03-30 NOTE — ED Notes (Signed)
Pt to room 6 by ems via stretcher reporting pt found lying on carpeted floor by staff at [facility]. Pt is awake and alert, pleasantly confused. When asked if she has any pain, states "No, honey, I'm OK."

## 2013-03-30 NOTE — ED Provider Notes (Signed)
CSN: [id number]     Arrival date & time 03/30/13  1456 History     This chart was scribed for Shanna Cisco, MD by Jiles Prows, ED Scribe. The patient was seen in room MH06/MH06 and the patient's care was started at 3:23 PM.  Chief Complaint  Patient presents with  . Fall   Patient is a 79 y.o. female presenting with fall and back pain. The history is provided by the patient and medical records. No language interpreter was used.  Fall  Back Pain  HPI Comments: Level 5 Caveat due to dementia. Sarah Alvarado is a 79 y.o. female BIB EMS from long term care facility with a h/o HTN, dementia, back pain, neuropathy, and PVD who presents to the Emergency Department after falling.  Pt does not recall the fall.  She is complaining of mild pain to suprapubic abdomen, but denies this pain a few minutes later after discussing other topics.  Pt reports that she is urinating frequently and urinates in the night.  She does not remember when it began.  Pt states that she falls infrequently because she knows how to "skip and jump."  Pt complains of chest pain about half an hour ago that has resolved.  Pt complains of pain to left calf.  Pt denies headache, diaphoresis, fever, chills, nausea, vomiting, diarrhea, weakness, cough, SOB and any other pain.   Pt claims she ambulates without assistance.  Pt does not know where she lives.  Nurse states that pt was found laying on the carpeted floor.  Nurse states she lives at [facility].  EMS states that she complained of low back pain upon arrival.    Past Medical History  Diagnosis Date  . Hypertension   . Depression   . Dementia   . Back pain   . Neuropathy     peripheral  . Constipation   . Eczema   . Peptic ulcer   . Peripheral neuropathy   . Aortic aneurysm   . Allergic rhinitis   . IC (irritable colon)   . Hearing loss   . Tremor   . Eczema   . HA (headache)   . Back pain   . Aortic valve disease   . Diverticulitis   . Chronic left hip  pain   . Chronic low back pain   . PVD (peripheral vascular disease)    History reviewed. No pertinent past surgical history. History reviewed. No pertinent family history. History  Substance Use Topics  . Smoking status: Unknown If Ever Smoked  . Smokeless tobacco: Not on file  . Alcohol Use: No   OB History   Grav Para Term Preterm Abortions TAB SAB Ect Mult Living                 Review of Systems  Unable to perform ROS: Dementia    Allergies  Review of patient's allergies indicates no known allergies.  Home Medications   Current Outpatient Rx  Name  Route  Sig  Dispense  Refill  . HYDROcodone-acetaminophen (NORCO/VICODIN) 5-325 MG per tablet   Oral   Take 1 tablet by mouth every 4 (four) hours as needed for pain.         Marland Kitchen alum & mag hydroxide-simeth (MAALOX/MYLANTA) 200-200-20 MG/5ML suspension   Oral   Take 15 mLs by mouth every 6 (six) hours as needed. For indigestion         . aspirin 81 MG chewable tablet   Oral  Chew 81 mg by mouth daily.         . Atorvastatin Calcium (LIPITOR PO)   Oral   Take 5 mg by mouth at bedtime.         . Cholecalciferol (VITAMIN D3) 1000 UNITS CAPS   Oral   Take 1 capsule by mouth daily.         . Cranberry 475 MG CAPS   Oral   Take 1 capsule by mouth 2 (two) times daily.         Marland Kitchen docusate sodium (COLACE) 250 MG capsule   Oral   Take 250 mg by mouth daily.         Marland Kitchen donepezil (ARICEPT) 10 MG tablet   Oral   Take 1 tablet (10 mg total) by mouth at bedtime.   30 tablet   0   . DULoxetine (CYMBALTA) 30 MG capsule   Oral   Take 30 mg by mouth daily. Take along with 60mg  capsule by mouth each day for a total of 90mg  by mouth daily.         . DULoxetine (CYMBALTA) 60 MG capsule   Oral   Take 60 mg by mouth daily. Take along with a 30 mg capsule for a total of 90mg  by mouth daily         . fluocinolone (VANOS) 0.01 % cream   Topical   Apply 1 application topically 2 (two) times daily as needed.  To affected areas         . gabapentin (NEURONTIN) 300 MG capsule   Oral   Take 300 mg by mouth 2 (two) times daily.         . magnesium hydroxide (MILK OF MAGNESIA) 400 MG/5ML suspension   Oral   Take 15 mLs by mouth daily as needed. For constipation         . omeprazole (PRILOSEC) 20 MG capsule   Oral   Take 20 mg by mouth daily.         Marland Kitchen EXPIRED: propranolol (INDERAL) 10 MG tablet   Oral   Take 1 tablet (10 mg total) by mouth 3 (three) times daily.   90 tablet   0   . QUEtiapine (SEROQUEL) 25 MG tablet   Oral   Take 1 tablet (25 mg total) by mouth 2 (two) times daily.   30 tablet   0   . SUMAtriptan (IMITREX) 50 MG tablet   Oral   Take 50 mg by mouth every 2 (two) hours as needed. For headache         . topiramate (TOPAMAX) 50 MG tablet   Oral   Take 50 mg by mouth 3 (three) times daily.         . traMADol (ULTRAM) 50 MG tablet   Oral   Take 1 tablet (50 mg total) by mouth 4 (four) times daily as needed. For pain   30 tablet   0   . triamcinolone lotion (KENALOG) 0.1 %   Topical   Apply 1 application topically daily. Apply to red itchy area every day directed          BP 176/73  Pulse 74  Temp(Src) 98.3 F (36.8 C) (Oral)  Resp 18  SpO2 100% Physical Exam  Nursing note and vitals reviewed. Constitutional: She is oriented to person, place, and time. She appears well-developed and well-nourished. No distress.  HENT:  Head: Normocephalic.  Mouth/Throat: Oropharynx is clear and moist.  Eyes: Pupils are  equal, round, and reactive to light.  Neck: Neck supple.  Cardiovascular: Normal rate and regular rhythm.   Murmur (blowing systolic 2/5) heard. Pulmonary/Chest: Effort normal and breath sounds normal. No respiratory distress. She has no wheezes. She has no rales.  No crackles or wheezing.  Abdominal: Soft. Bowel sounds are normal. She exhibits no distension. There is tenderness. There is no rebound and no guarding.  Superpubic tenderness.   No guarding or rebound.  Musculoskeletal: She exhibits no edema and no tenderness.  Pt with normal gait.  Neurological: She is alert and oriented to person, place, and time. She displays no tremor. No cranial nerve deficit or sensory deficit. She exhibits normal muscle tone. Coordination and gait normal. GCS eye subscore is 4. GCS verbal subscore is 5. GCS motor subscore is 6.  Skin: Skin is warm and dry.  Psychiatric: She has a normal mood and affect.  Pt does not know year.    ED Course   Procedures (including critical care time) DIAGNOSTIC STUDIES: Filed Vitals:   03/30/13 1920  BP: 129/100  Pulse: 76  Temp: 98.5 F (36.9 C)  Resp: 18   COORDINATION OF CARE: 3:34 PM - Discussed ED treatment with pt at bedside.  Labs Reviewed  CBC WITH DIFFERENTIAL - Abnormal; Notable for the following:    WBC 10.7 (*)    RBC 3.82 (*)    Monocytes Absolute 1.3 (*)    All other components within normal limits  COMPREHENSIVE METABOLIC PANEL - Abnormal; Notable for the following:    Glucose, Bld 105 (*)    Albumin 3.3 (*)    Total Bilirubin 0.1 (*)    GFR calc non Af Amer 46 (*)    GFR calc Af Amer 54 (*)    All other components within normal limits  URINALYSIS, ROUTINE W REFLEX MICROSCOPIC  TROPONIN I   Dg Chest 2 View  03/30/2013   *RADIOLOGY REPORT*  Clinical Data: Dizziness and fall  CHEST - 2 VIEW  Comparison: 02/11/2012  Findings: The cardiac silhouette is normal in size and configuration.  The aorta is uncoiled.  No mediastinal or hilar masses are noted.  There is mild scarring in the apices.  A healed granuloma is noted in the medial left upper lobe.  These findings are stable.  The lungs are otherwise clear.  No pleural effusion or pneumothorax.  The bony thorax is demineralized with old healed right rib fractures, but otherwise intact.  IMPRESSION: No acute cardiopulmonary disease.   Original Report Authenticated By: Amie Portland, M.D.   Ct Head Wo Contrast  03/30/2013    *RADIOLOGY REPORT*  Clinical Data: Fall, dimension  CT HEAD WITHOUT CONTRAST  Technique:  Contiguous axial images were obtained from the base of the skull through the vertex without contrast.  Comparison: Head CT 01/16/2013  Findings: No intracranial hemorrhage.  No parenchymal contusion. No midline shift or mass effect.  Basilar cisterns are patent. No skull base fracture.  No fluid in the paranasal sinuses or mastoid air cells.  There are periventricular white matter hypodensities unchanged. Mild cortical atrophy.  IMPRESSION:  1.  No acute intracranial trauma. 2.  Chronic atrophy and microvascular disease.   Original Report Authenticated By: Genevive Bi, M.D.   1. Fall at nursing home, initial encounter     5:31 per nursing home, staff reports found laying on floor, said she got dizzy, fell down & hit her head. Pt was at baseline mental status.  No recent illnesses.  Have added trop,  CT head, CXR.   Date: 03/30/2013  Rate: 74  Rhythm: normal sinus rhythm  QRS Axis: normal  Intervals: normal  ST/T Wave abnormalities: normal  Conduction Disutrbances:none  Narrative Interpretation: Possible RA enlargment  Old EKG Reviewed: none available    MDM  Pt is a 79 y.o. female with Pmhx as above who presents with unwittnessed fall at nursing facility.  Pt complains of suprapubic pain (inconsistently) on exam, but denies current CP, h/a, neck pain, back pain, extremity pain.  She is alert but not oriented, has no focal neuro findings.  CBC with mild leukocytosis, urine not infected.  CMP unremarkable, trop negative, CT head negative, CXR negative.  I doubt ACS, CVA, acute surgical cause of abdominal pain. I believe she is safe to be d/c'c back to facility with return precautions for new or worsening symptoms.    1. Fall at nursing home, initial encounter      I personally performed the services described in this documentation, which was scribed in my presence. The recorded information has been  reviewed and is accurate.  Shanna Cisco, MD 03/31/13 [phone]

## 2013-03-30 NOTE — ED Notes (Signed)
Patient changed into gown, waist up. 

## 2013-05-12 ENCOUNTER — Other Ambulatory Visit (HOSPITAL_COMMUNITY): Payer: Self-pay | Admitting: Geriatric Medicine

## 2013-05-12 DIAGNOSIS — R1310 Dysphagia, unspecified: Secondary | ICD-10-CM

## 2013-05-21 ENCOUNTER — Ambulatory Visit (HOSPITAL_COMMUNITY)
Admission: RE | Admit: 2013-05-21 | Discharge: 2013-05-21 | Disposition: A | Discharge status: Home / Self Care | Payer: Medicare Other | Source: Ambulatory Visit | Attending: Geriatric Medicine | Admitting: Geriatric Medicine

## 2013-05-21 DIAGNOSIS — R1310 Dysphagia, unspecified: Secondary | ICD-10-CM

## 2013-05-21 NOTE — Procedures (Signed)
Objective Swallowing Evaluation: Modified Barium Swallowing Study  Patient Details  Name: Kristopher Delk MRN: [id number] Date of Birth: 21-Jun-1932  Today's Date: 05/21/2013 Time: 0865-7846    Past Medical History:  Past Medical History  Diagnosis Date  . Hypertension   . Depression   . Dementia   . Back pain   . Neuropathy     peripheral  . Constipation   . Eczema   . Peptic ulcer   . Peripheral neuropathy   . Aortic aneurysm   . Allergic rhinitis   . IC (irritable colon)   . Hearing loss   . Tremor   . Eczema   . HA (headache)   . Back pain   . Aortic valve disease   . Diverticulitis   . Chronic left hip pain   . Chronic low back pain   . PVD (peripheral vascular disease)    Past Surgical History: No past surgical history on file. HPI:  79 yo female resident of [facility] referred by SLP for MBS due to concerns pt may be aspirating.  Pt PMH + for dementia, HTN, hearing loss, allergic rhinitis, urinary incontinence, peripheral neuropathy and constipation.  Pt dysphagia symptoms per referral form consists of watery eyes during meals, coughing during meals and decreased mental status.  Diet is regular/thin.  Pt admits to loss of dentures impacting her ability to eat.  Per medication form, she is taking a PPI.       Assessment / Plan / Recommendation Clinical Impression  Dysphagia Diagnosis: Mild oral phase dysphagia;Suspected primary esophageal dysphagia Clinical impression: Pt presented with mild oral and suspected primary esophageal dysphagia without aspiration or penetration of any consistency tested.   Oral dysphagia characterized by delayed oral transiting with lingual pumping resulting in premature spillage of barium into pharynx.  Pharyngeal swallow was strong and timely without signficant stasis.  Pt did not cough throughout entire evaluation, however toward middle to end of testing, she consistently belched.  Radiographic findings and pt's symptoms appear consistent  with primary esophageal dysphagia.  Soft diet recommended due to pt's lack of dentition.  SLP used video monitor to educate her to compensation strategies to mitigate risk.  Rec pt follow strict aspiration and esophageal precautions to mitigate risk of aspirating refluxed material, following solids with liquids appeared to faciliate clearance.  Thanks for this referral.     Treatment Recommendation  Defer treatment plan to SLP at (Comment) ([facility])    Diet Recommendation Dysphagia 3 (Mechanical Soft)   Liquid Administration via: Cup;Straw Medication Administration: Whole meds with liquid Supervision: Patient able to self feed;Full supervision/cueing for compensatory strategies Compensations: Slow rate;Small sips/bites;Follow solids with liquid Postural Changes and/or Swallow Maneuvers: Seated upright 90 degrees;Upright 30-60 min after meal    Other  Recommendations Recommended Consults: Consider esophageal assessment Oral Care Recommendations: Oral care BID   Follow Up Recommendations  Other (comment) (continue follow up with referring SLP)    Frequency and Duration        Pertinent Vitals/Pain Appeared comfortable    SLP Swallow Goals     General Date of Onset: 05/21/13 HPI: 79 yo female resident of [facility] referred by SLP for MBS due to concerns pt may be aspirating.  Pt PMH + for dementia, HTN, hearing loss, allergic rhinitis, urinary incontinence, peripheral neuropathy and constipation.  Pt dysphagia symptoms per referral form consists of watery eyes during meals, coughing during meals and decreased mental status.  Diet is regular/thin.  Pt admits to loss of dentures impacting her  ability to eat.  Per medication form, she is taking a PPI.   Type of Study: Modified Barium Swallowing Study Reason for Referral: Objectively evaluate swallowing function Diet Prior to this Study: Regular;Thin liquids Temperature Spikes Noted: No Respiratory Status: Room air History of Recent  Intubation: No Behavior/Cognition: Alert;Hard of hearing;Cooperative;Requires cueing Oral Cavity - Dentition: Missing dentition;Dentures, not available (pt reports top denture missing, lower natural dentition) Oral Motor / Sensory Function: Within functional limits Self-Feeding Abilities: Able to feed self Patient Positioning: Upright in chair Baseline Vocal Quality: Clear Volitional Cough: Strong Volitional Swallow: Able to elicit Anatomy: Within functional limits Pharyngeal Secretions: Not observed secondary MBS    Reason for Referral Objectively evaluate swallowing function   Oral Phase Oral Preparation/Oral Phase Oral Phase: Impaired Oral - Nectar Oral - Nectar Cup: Weak lingual manipulation;Delayed oral transit;Piecemeal swallowing Oral - Thin Oral - Thin Cup: Weak lingual manipulation;Delayed oral transit;Piecemeal swallowing Oral - Thin Straw: Weak lingual manipulation;Delayed oral transit;Piecemeal swallowing Oral - Solids Oral - Puree: Weak lingual manipulation;Delayed oral transit Oral - Regular: Weak lingual manipulation;Delayed oral transit;Impaired mastication Oral - Pill: Weak lingual manipulation;Delayed oral transit (pt took tablet with thin liquids, required several boluses to swallow)   Pharyngeal Phase Pharyngeal Phase Pharyngeal Phase: Impaired Pharyngeal - Nectar Pharyngeal - Nectar Cup: Premature spillage to valleculae;Pharyngeal residue - valleculae Pharyngeal - Thin Pharyngeal - Thin Cup: Premature spillage to valleculae;Pharyngeal residue - valleculae Pharyngeal - Thin Straw: Premature spillage to valleculae;Pharyngeal residue - valleculae Pharyngeal - Solids Pharyngeal - Puree: Premature spillage to valleculae Pharyngeal - Regular: Premature spillage to valleculae Pharyngeal Phase - Comment Pharyngeal Comment: trace vallecular stasis cleared with delayed cued and dry swallows  Cervical Esophageal Phase    GO    Cervical Esophageal Phase Cervical  Esophageal Phase: Impaired Cervical Esophageal Phase - Nectar Nectar Cup: Prominent cricopharyngeal segment Cervical Esophageal Phase - Thin Thin Cup: Prominent cricopharyngeal segment Thin Straw: Prominent cricopharyngeal segment Cervical Esophageal Phase - Comment Cervical Esophageal Comment: Appearance of delayed clearance distally - with appearance of secretions retained in esophagus.  Pt did not have any awareness to stasis or suspected tertiary contractions/retrograde propulsion.  Barium tablet taken with thin appeared to lodge at mid esophagus without pt sensation, further bolus of barium cleared tablet into esophagus.  Thin liquids also appeared to faciliate clearance of pudding/cracker.  Findings may be consistent with dysmotility.   Pt noted to belch from middle to end of testing pointing to mid-sternum.      Functional Assessment Tool Used: mbs, clinical judgement Functional Limitations: Swallowing Swallow Current Status (Z6109): At least 20 percent but less than 40 percent impaired, limited or restricted Swallow Goal Status [phone]): At least 20 percent but less than 40 percent impaired, limited or restricted Swallow Discharge Status [phone]): At least 20 percent but less than 40 percent impaired, limited or restricted    Donavan Burnet, MS [facility] SLP [phone]

## 2013-08-25 ENCOUNTER — Encounter (HOSPITAL_BASED_OUTPATIENT_CLINIC_OR_DEPARTMENT_OTHER): Payer: Self-pay | Admitting: Emergency Medicine

## 2013-08-25 ENCOUNTER — Emergency Department (HOSPITAL_BASED_OUTPATIENT_CLINIC_OR_DEPARTMENT_OTHER): Payer: Medicare Other

## 2013-08-25 ENCOUNTER — Emergency Department (HOSPITAL_BASED_OUTPATIENT_CLINIC_OR_DEPARTMENT_OTHER)
Admission: EM | Admit: 2013-08-25 | Discharge: 2013-08-25 | Disposition: A | Discharge status: Skilled Nursing Facility | Payer: Medicare Other | Attending: Emergency Medicine | Admitting: Emergency Medicine

## 2013-08-25 DIAGNOSIS — G609 Hereditary and idiopathic neuropathy, unspecified: Secondary | ICD-10-CM | POA: Insufficient documentation

## 2013-08-25 DIAGNOSIS — I10 Essential (primary) hypertension: Secondary | ICD-10-CM | POA: Insufficient documentation

## 2013-08-25 DIAGNOSIS — F329 Major depressive disorder, single episode, unspecified: Secondary | ICD-10-CM | POA: Insufficient documentation

## 2013-08-25 DIAGNOSIS — N390 Urinary tract infection, site not specified: Secondary | ICD-10-CM | POA: Insufficient documentation

## 2013-08-25 DIAGNOSIS — F3289 Other specified depressive episodes: Secondary | ICD-10-CM | POA: Insufficient documentation

## 2013-08-25 DIAGNOSIS — G8929 Other chronic pain: Secondary | ICD-10-CM | POA: Insufficient documentation

## 2013-08-25 DIAGNOSIS — K589 Irritable bowel syndrome without diarrhea: Secondary | ICD-10-CM | POA: Insufficient documentation

## 2013-08-25 DIAGNOSIS — Z8739 Personal history of other diseases of the musculoskeletal system and connective tissue: Secondary | ICD-10-CM | POA: Insufficient documentation

## 2013-08-25 DIAGNOSIS — R109 Unspecified abdominal pain: Secondary | ICD-10-CM | POA: Insufficient documentation

## 2013-08-25 DIAGNOSIS — Z79899 Other long term (current) drug therapy: Secondary | ICD-10-CM | POA: Insufficient documentation

## 2013-08-25 DIAGNOSIS — Z8709 Personal history of other diseases of the respiratory system: Secondary | ICD-10-CM | POA: Insufficient documentation

## 2013-08-25 DIAGNOSIS — F0390 Unspecified dementia without behavioral disturbance: Secondary | ICD-10-CM | POA: Insufficient documentation

## 2013-08-25 DIAGNOSIS — Z7982 Long term (current) use of aspirin: Secondary | ICD-10-CM | POA: Insufficient documentation

## 2013-08-25 DIAGNOSIS — Z872 Personal history of diseases of the skin and subcutaneous tissue: Secondary | ICD-10-CM | POA: Insufficient documentation

## 2013-08-25 LAB — BASIC METABOLIC PANEL
BUN: 20 mg/dL (ref 6–23)
CO2: 25 meq/L (ref 19–32)
Calcium: 9.2 mg/dL (ref 8.4–10.5)
Chloride: 107 mEq/L (ref 96–112)
Creatinine, Ser: 1.2 mg/dL — ABNORMAL HIGH (ref 0.50–1.10)
GFR calc Af Amer: 48 mL/min — ABNORMAL LOW (ref >90)
GFR calc non Af Amer: 42 mL/min — ABNORMAL LOW (ref >90)
GLUCOSE: 141 mg/dL — AB (ref 70–99)
Potassium: 3.9 mEq/L (ref 3.7–5.3)
SODIUM: 144 meq/L (ref 137–147)

## 2013-08-25 LAB — CBC WITH DIFFERENTIAL/PLATELET
Basophils Absolute: 0 10*3/uL (ref 0.0–0.1)
Basophils Relative: 0 % (ref 0–1)
EOS ABS: 0.3 10*3/uL (ref 0.0–0.7)
Eosinophils Relative: 2 % (ref 0–5)
HEMATOCRIT: 36.4 % (ref 36.0–46.0)
HEMOGLOBIN: 11.6 g/dL — AB (ref 12.0–15.0)
LYMPHS ABS: 2.3 10*3/uL (ref 0.7–4.0)
LYMPHS PCT: 19 % (ref 12–46)
MCH: 30.4 pg (ref 26.0–34.0)
MCHC: 31.9 g/dL (ref 30.0–36.0)
MCV: 95.5 fL (ref 78.0–100.0)
MONO ABS: 1.5 10*3/uL — AB (ref 0.1–1.0)
MONOS PCT: 12 % (ref 3–12)
Neutro Abs: 8.2 10*3/uL — ABNORMAL HIGH (ref 1.7–7.7)
Neutrophils Relative %: 67 % (ref 43–77)
Platelets: 302 10*3/uL (ref 150–400)
RBC: 3.81 MIL/uL — ABNORMAL LOW (ref 3.87–5.11)
RDW: 12.8 % (ref 11.5–15.5)
WBC: 12.2 10*3/uL — AB (ref 4.0–10.5)

## 2013-08-25 LAB — URINE MICROSCOPIC-ADD ON

## 2013-08-25 LAB — URINALYSIS, ROUTINE W REFLEX MICROSCOPIC
GLUCOSE, UA: NEGATIVE mg/dL
Hgb urine dipstick: NEGATIVE
Ketones, ur: 15 mg/dL — AB
Nitrite: NEGATIVE
PROTEIN: NEGATIVE mg/dL
Specific Gravity, Urine: 1.022 (ref 1.005–1.030)
Urobilinogen, UA: 1 mg/dL (ref 0.0–1.0)
pH: 6 (ref 5.0–8.0)

## 2013-08-25 MED ORDER — CEPHALEXIN 500 MG PO CAPS: 500.0000 mg | ORAL_CAPSULE | Freq: Four times a day (QID) | ORAL | Status: DC

## 2013-08-25 NOTE — ED Notes (Signed)
Sent from [facility] for rule out pneumonia.

## 2013-08-25 NOTE — ED Notes (Signed)
Assisted RN Mosetta PuttFeng an in & out cath

## 2013-08-25 NOTE — ED Provider Notes (Addendum)
CSN: [id number]     Arrival date & time 08/25/13  1213 History   First MD Initiated Contact with Patient 08/25/13 1230     Chief Complaint  Patient presents with  . Rule out pneumonia    (Consider location/radiation/quality/duration/timing/severity/associated sxs/prior Treatment) HPI Comments: Patient sent to the emergency room today by a nurse practitioner who wanted her to be further evaluated. Question of a concern for pneumonia however patient has had intermittent constipation and abdominal pain. This information was given by EMS. On exam patient does grab her lower abdomen and says she has soreness there o/w no c/o.  The history is provided by the nursing home and the EMS personnel. The history is limited by the absence of a caregiver and the condition of the patient.    Past Medical History  Diagnosis Date  . Hypertension   . Depression   . Dementia   . Back pain   . Neuropathy     peripheral  . Constipation   . Eczema   . Peptic ulcer   . Peripheral neuropathy   . Aortic aneurysm   . Allergic rhinitis   . IC (irritable colon)   . Hearing loss   . Tremor   . Eczema   . HA (headache)   . Back pain   . Aortic valve disease   . Diverticulitis   . Chronic left hip pain   . Chronic low back pain   . PVD (peripheral vascular disease)    History reviewed. No pertinent past surgical history. No family history on file. History  Substance Use Topics  . Smoking status: Unknown If Ever Smoked  . Smokeless tobacco: Not on file  . Alcohol Use: No   OB History   Grav Para Term Preterm Abortions TAB SAB Ect Mult Living                 Review of Systems  Unable to perform ROS   Allergies  Review of patient's allergies indicates no known allergies.  Home Medications   Current Outpatient Rx  Name  Route  Sig  Dispense  Refill  . alum & mag hydroxide-simeth (MAALOX/MYLANTA) 200-200-20 MG/5ML suspension   Oral   Take 15 mLs by mouth every 6 (six) hours as needed. For  indigestion         . aspirin 81 MG chewable tablet   Oral   Chew 81 mg by mouth daily.         . Atorvastatin Calcium (LIPITOR PO)   Oral   Take 5 mg by mouth at bedtime.         . Cholecalciferol (VITAMIN D3) 1000 UNITS CAPS   Oral   Take 1 capsule by mouth daily.         . Cranberry 475 MG CAPS   Oral   Take 1 capsule by mouth 2 (two) times daily.         Marland Kitchen docusate sodium (COLACE) 250 MG capsule   Oral   Take 250 mg by mouth daily.         Marland Kitchen donepezil (ARICEPT) 10 MG tablet   Oral   Take 1 tablet (10 mg total) by mouth at bedtime.   30 tablet   0   . DULoxetine (CYMBALTA) 30 MG capsule   Oral   Take 30 mg by mouth daily. Take along with 60mg  capsule by mouth each day for a total of 90mg  by mouth daily.         Marland Kitchen  DULoxetine (CYMBALTA) 60 MG capsule   Oral   Take 60 mg by mouth daily. Take along with a 30 mg capsule for a total of 90mg  by mouth daily         . fluocinolone (VANOS) 0.01 % cream   Topical   Apply 1 application topically 2 (two) times daily as needed. To affected areas         . gabapentin (NEURONTIN) 300 MG capsule   Oral   Take 300 mg by mouth 2 (two) times daily.         Marland Kitchen HYDROcodone-acetaminophen (NORCO/VICODIN) 5-325 MG per tablet   Oral   Take 1 tablet by mouth every 4 (four) hours as needed for pain.         . magnesium hydroxide (MILK OF MAGNESIA) 400 MG/5ML suspension   Oral   Take 15 mLs by mouth daily as needed. For constipation         . omeprazole (PRILOSEC) 20 MG capsule   Oral   Take 20 mg by mouth daily.         Marland Kitchen EXPIRED: propranolol (INDERAL) 10 MG tablet   Oral   Take 1 tablet (10 mg total) by mouth 3 (three) times daily.   90 tablet   0   . QUEtiapine (SEROQUEL) 25 MG tablet   Oral   Take 1 tablet (25 mg total) by mouth 2 (two) times daily.   30 tablet   0   . SUMAtriptan (IMITREX) 50 MG tablet   Oral   Take 50 mg by mouth every 2 (two) hours as needed. For headache         .  topiramate (TOPAMAX) 50 MG tablet   Oral   Take 50 mg by mouth 3 (three) times daily.         . traMADol (ULTRAM) 50 MG tablet   Oral   Take 1 tablet (50 mg total) by mouth 4 (four) times daily as needed. For pain   30 tablet   0   . triamcinolone lotion (KENALOG) 0.1 %   Topical   Apply 1 application topically daily. Apply to red itchy area every day directed          BP 140/63  Pulse 75  Temp(Src) 98.3 F (36.8 C) (Oral)  Resp 16  SpO2 100% Physical Exam  Nursing note and vitals reviewed. Constitutional: She is oriented to person, place, and time. She appears well-developed and well-nourished. No distress.  HENT:  Head: Normocephalic and atraumatic.  Mouth/Throat: Oropharynx is clear and moist.  Eyes: Conjunctivae and EOM are normal. Pupils are equal, round, and reactive to light.  Neck: Normal range of motion. Neck supple.  Cardiovascular: Normal rate, regular rhythm and intact distal pulses.   No murmur heard. Pulmonary/Chest: Effort normal and breath sounds normal. No respiratory distress. She has no wheezes. She has no rales.  Abdominal: Soft. She exhibits no distension. There is tenderness in the suprapubic area. There is no rebound and no guarding.  Musculoskeletal: Normal range of motion. She exhibits no edema and no tenderness.  Neurological: She is alert and oriented to person, place, and time.  Skin: Skin is warm and dry. No rash noted. No erythema.  Psychiatric: She has a normal mood and affect. Her behavior is normal.    ED Course  Procedures (including critical care time) Labs Review Labs Reviewed  CBC WITH DIFFERENTIAL - Abnormal; Notable for the following:    WBC 12.2 (*)  RBC 3.81 (*)    Hemoglobin 11.6 (*)    Neutro Abs 8.2 (*)    Monocytes Absolute 1.5 (*)    All other components within normal limits  BASIC METABOLIC PANEL - Abnormal; Notable for the following:    Glucose, Bld 141 (*)    Creatinine, Ser 1.20 (*)    GFR calc non Af Amer 42  (*)    GFR calc Af Amer 48 (*)    All other components within normal limits  URINALYSIS, ROUTINE W REFLEX MICROSCOPIC - Abnormal; Notable for the following:    Color, Urine AMBER (*)    APPearance CLOUDY (*)    Bilirubin Urine SMALL (*)    Ketones, ur 15 (*)    Leukocytes, UA SMALL (*)    All other components within normal limits  URINE MICROSCOPIC-ADD ON - Abnormal; Notable for the following:    Bacteria, UA MANY (*)    All other components within normal limits  URINE CULTURE   Imaging Review Dg Chest 2 View  08/25/2013   CLINICAL DATA:  Cough.  EXAM: CHEST  2 VIEW  COMPARISON:  March 30, 2013.  FINDINGS: The heart size and mediastinal contours are within normal limits. Both lungs are clear. No pleural effusion or pneumothorax is noted. Old right rib fractures are noted.  IMPRESSION: No active cardiopulmonary disease.   Electronically Signed   By: Roque LiasJames  Green M.D.   On: 08/25/2013 13:02    EKG Interpretation   None       MDM   1. UTI (lower urinary tract infection)     Patient is here for evaluation due to concern for possible pneumonia. Patient is demented and cannot give any history however there was no reported cough or fever. When asking the patient she complains that her lower abdomen hurting. No focal tenderness in the abdomen her lungs are clear.  CBC with mild leukocytosis of 12,000 but otherwise stable hemoglobin. Chest x-ray without acute disease. Will check a BMP and UA for further evaluation. Otherwise patient is hemodynamically stable it can go back to her nursing facility.  2:28 PM Labs are normal except for early UTI.  Will treat with keflex and d/c home.  Gwyneth SproutWhitney Debbi Strandberg, MD 08/25/13 1428  Gwyneth SproutWhitney Dorisann Schwanke, MD 08/25/13 1430

## 2013-08-25 NOTE — ED Notes (Signed)
PTAR called and report given to staff at [facility]

## 2013-08-27 ENCOUNTER — Telehealth (HOSPITAL_COMMUNITY): Payer: Self-pay

## 2013-08-27 LAB — URINE CULTURE

## 2013-08-27 NOTE — ED Notes (Signed)
Positive Urine culture- resistant to Keflex. Will send to EDP for review.

## 2013-08-28 ENCOUNTER — Telehealth (HOSPITAL_COMMUNITY): Payer: Self-pay | Admitting: *Deleted

## 2013-08-28 ENCOUNTER — Telehealth (HOSPITAL_COMMUNITY): Payer: Self-pay

## 2013-08-28 NOTE — Progress Notes (Signed)
ED Antimicrobial Stewardship Positive Culture Follow Up   Sarah Alvarado is an 80 y.o. female who presented to [facility]25[facility]2013 with a chief complaint of  Chief Complaint  Patient presents with  . Rule out pneumonia     Recent Results (from the past 720 hour(s))  URINE CULTURE     Status: None   Collection Time    08/25/13  1:55 PM      Result Value Range Status   Specimen Description URINE, CATHETERIZED   Final   Special Requests NONE   Final   Culture  Setup Time     Final   Value: 08/25/2013 23:17     Performed at [facility]   Colony Count     Final   Value: >=100,000 COLONIES/ML     Performed at [facility]   Culture     Final   Value: ESCHERICHIA COLI     Note: Confirmed Extended Spectrum Beta-Lactamase Producer (ESBL) CRITICAL RESULT CALLED TO, READ BACK BY AND VERIFIED WITH: TONY F. @930AM  1.15.15 BY BUONO     Performed at [facility]   Report Status 08/27/2013 FINAL   Final   Organism ID, Bacteria ESCHERICHIA COLI   Final    [x]  Treated with Cephalexin, organism resistant to prescribed antimicrobial []  Patient discharged originally without antimicrobial agent and treatment is now indicated  Fax results to patient's facility.  New antibiotic prescription: Fosfomycin 3g PO x 1 dose  ED Provider: Trixie DredgeEmily West, PA-C   Cleon DewDulaney, [place] Robert 08/28/2013, 2:57 PM Infectious Diseases Pharmacist Phone# [phone]

## 2013-08-28 NOTE — ED Notes (Signed)
[facility] to [facility] but operator sts there is no such resident.

## 2013-08-28 NOTE — ED Notes (Signed)
CHANGE FROM PREVIOUS ORDER Post ED Visit - Positive Culture Follow-up: Successful Patient Follow-Up  Culture assessed and recommendations reviewed by: [x]  Wes Dulaney, Pharm.D., BCPS []  Celedonio MiyamotoJeremy Frens, Pharm.D., BCPS []  Georgina PillionElizabeth Martin, [address] BCPS []  [place] Union[place]h Pham, [address]arm[address] BCPS, AAHIVP []  Estella HuskMichelle Turner, Pharm.D., BCPS, AAHIVP  Positive Urine culture>/=100,000 colonies Ecoli (ESBL)  []  Patient discharged without antimicrobial prescription and treatment is now indicated [x]  Organism is resistant to prescribed ED discharge antimicrobial []  Patient with positive blood cultures  Changes discussed with ED provider: Trixie DredgeEmily West PA New antibiotic prescription Fosfomycin 3 gram x 1 dose ( If facility calls back and they don't have this drug please refer back to [facility]) [facility] Fax # [phone] date 08/28/13 time 17:35   Arvid RightClark, Dani Danis Dorn 08/28/2013, 5:42 PM

## 2013-09-07 ENCOUNTER — Emergency Department (HOSPITAL_BASED_OUTPATIENT_CLINIC_OR_DEPARTMENT_OTHER)
Admission: EM | Admit: 2013-09-07 | Discharge: 2013-09-07 | Disposition: A | Discharge status: Home / Self Care | Payer: Medicare Other | Attending: Emergency Medicine | Admitting: Emergency Medicine

## 2013-09-07 ENCOUNTER — Emergency Department (HOSPITAL_BASED_OUTPATIENT_CLINIC_OR_DEPARTMENT_OTHER): Payer: Medicare Other

## 2013-09-07 ENCOUNTER — Encounter (HOSPITAL_BASED_OUTPATIENT_CLINIC_OR_DEPARTMENT_OTHER): Payer: Self-pay | Admitting: Emergency Medicine

## 2013-09-07 DIAGNOSIS — Z79899 Other long term (current) drug therapy: Secondary | ICD-10-CM | POA: Insufficient documentation

## 2013-09-07 DIAGNOSIS — Z7982 Long term (current) use of aspirin: Secondary | ICD-10-CM | POA: Insufficient documentation

## 2013-09-07 DIAGNOSIS — Z872 Personal history of diseases of the skin and subcutaneous tissue: Secondary | ICD-10-CM | POA: Insufficient documentation

## 2013-09-07 DIAGNOSIS — Z8709 Personal history of other diseases of the respiratory system: Secondary | ICD-10-CM | POA: Insufficient documentation

## 2013-09-07 DIAGNOSIS — I10 Essential (primary) hypertension: Secondary | ICD-10-CM | POA: Insufficient documentation

## 2013-09-07 DIAGNOSIS — F329 Major depressive disorder, single episode, unspecified: Secondary | ICD-10-CM | POA: Insufficient documentation

## 2013-09-07 DIAGNOSIS — Z8711 Personal history of peptic ulcer disease: Secondary | ICD-10-CM | POA: Insufficient documentation

## 2013-09-07 DIAGNOSIS — F29 Unspecified psychosis not due to a substance or known physiological condition: Secondary | ICD-10-CM | POA: Insufficient documentation

## 2013-09-07 DIAGNOSIS — G8929 Other chronic pain: Secondary | ICD-10-CM | POA: Insufficient documentation

## 2013-09-07 DIAGNOSIS — R109 Unspecified abdominal pain: Secondary | ICD-10-CM | POA: Insufficient documentation

## 2013-09-07 DIAGNOSIS — F0390 Unspecified dementia without behavioral disturbance: Secondary | ICD-10-CM | POA: Insufficient documentation

## 2013-09-07 DIAGNOSIS — F3289 Other specified depressive episodes: Secondary | ICD-10-CM | POA: Insufficient documentation

## 2013-09-07 DIAGNOSIS — H9190 Unspecified hearing loss, unspecified ear: Secondary | ICD-10-CM | POA: Insufficient documentation

## 2013-09-07 DIAGNOSIS — Z87440 Personal history of urinary (tract) infections: Secondary | ICD-10-CM | POA: Insufficient documentation

## 2013-09-07 DIAGNOSIS — K5900 Constipation, unspecified: Secondary | ICD-10-CM | POA: Insufficient documentation

## 2013-09-07 DIAGNOSIS — J069 Acute upper respiratory infection, unspecified: Secondary | ICD-10-CM

## 2013-09-07 LAB — CBC WITH DIFFERENTIAL/PLATELET
BASOS ABS: 0 10*3/uL (ref 0.0–0.1)
BASOS PCT: 0 % (ref 0–1)
EOS PCT: 5 % (ref 0–5)
Eosinophils Absolute: 0.4 10*3/uL (ref 0.0–0.7)
HCT: 40.1 % (ref 36.0–46.0)
Hemoglobin: 12.9 g/dL (ref 12.0–15.0)
Lymphocytes Relative: 28 % (ref 12–46)
Lymphs Abs: 2.4 10*3/uL (ref 0.7–4.0)
MCH: 30.7 pg (ref 26.0–34.0)
MCHC: 32.2 g/dL (ref 30.0–36.0)
MCV: 95.5 fL (ref 78.0–100.0)
Monocytes Absolute: 0.8 10*3/uL (ref 0.1–1.0)
Monocytes Relative: 9 % (ref 3–12)
NEUTROS PCT: 59 % (ref 43–77)
Neutro Abs: 5.2 10*3/uL (ref 1.7–7.7)
Platelets: 359 10*3/uL (ref 150–400)
RBC: 4.2 MIL/uL (ref 3.87–5.11)
RDW: 12.8 % (ref 11.5–15.5)
WBC: 8.8 10*3/uL (ref 4.0–10.5)

## 2013-09-07 LAB — COMPREHENSIVE METABOLIC PANEL
ALBUMIN: 3 g/dL — AB (ref 3.5–5.2)
ALK PHOS: 81 U/L (ref 39–117)
ALT: 7 U/L (ref 0–35)
AST: 14 U/L (ref 0–37)
BUN: 15 mg/dL (ref 6–23)
CALCIUM: 9.4 mg/dL (ref 8.4–10.5)
CO2: 24 mEq/L (ref 19–32)
Chloride: 106 mEq/L (ref 96–112)
Creatinine, Ser: 1.1 mg/dL (ref 0.50–1.10)
GFR calc Af Amer: 54 mL/min — ABNORMAL LOW (ref >90)
GFR calc non Af Amer: 46 mL/min — ABNORMAL LOW (ref >90)
Glucose, Bld: 98 mg/dL (ref 70–99)
POTASSIUM: 4.3 meq/L (ref 3.7–5.3)
SODIUM: 143 meq/L (ref 137–147)
TOTAL PROTEIN: 7.1 g/dL (ref 6.0–8.3)
Total Bilirubin: 0.3 mg/dL (ref 0.3–1.2)

## 2013-09-07 LAB — URINALYSIS, ROUTINE W REFLEX MICROSCOPIC
BILIRUBIN URINE: NEGATIVE
GLUCOSE, UA: NEGATIVE mg/dL
HGB URINE DIPSTICK: NEGATIVE
Ketones, ur: NEGATIVE mg/dL
Leukocytes, UA: NEGATIVE
Nitrite: NEGATIVE
PH: 7 (ref 5.0–8.0)
Protein, ur: NEGATIVE mg/dL
SPECIFIC GRAVITY, URINE: 1.014 (ref 1.005–1.030)
Urobilinogen, UA: 0.2 mg/dL (ref 0.0–1.0)

## 2013-09-07 MED ORDER — IOHEXOL 300 MG/ML  SOLN
50.0000 mL | Freq: Once | INTRAMUSCULAR | Status: AC | PRN
  Administered 2013-09-07: 50 mL via ORAL

## 2013-09-07 MED ORDER — POLYETHYLENE GLYCOL 3350 17 G PO PACK: 17.0000 g | PACK | Freq: Every day | ORAL | Status: AC

## 2013-09-07 MED ORDER — OSELTAMIVIR PHOSPHATE 75 MG PO CAPS: 75.0000 mg | ORAL_CAPSULE | Freq: Two times a day (BID) | ORAL | Status: AC

## 2013-09-07 MED ORDER — IOHEXOL 300 MG/ML  SOLN
80.0000 mL | Freq: Once | INTRAMUSCULAR | Status: AC | PRN
  Administered 2013-09-07: 80 mL via INTRAVENOUS

## 2013-09-07 NOTE — ED Provider Notes (Signed)
CSN: [id number]     Arrival date & time 09/07/13  1147 History   First MD Initiated Contact with Patient 09/07/13 1156     Chief Complaint  Patient presents with  . Influenza   (Consider location/radiation/quality/duration/timing/severity/associated sxs/prior Treatment) HPI Comments: Patient presents from [facility] with cough and congestion. This report was given by the caretaker at [facility]. The patient has dementia and her history is limited. She was seen here on January 15 and diagnosed with a UTI. She completed a course of Keflex for this. She's been reporting some left-sided abdominal pain. Per the nurse she started having some cough and runny nose yesterday. It's unknown if she's had any fevers. She hasn't had any none vomiting.  Patient is a 80 y.o. female presenting with flu symptoms.  Influenza   Past Medical History  Diagnosis Date  . Hypertension   . Depression   . Dementia   . Back pain   . Neuropathy     peripheral  . Constipation   . Eczema   . Peptic ulcer   . Peripheral neuropathy   . Aortic aneurysm   . Allergic rhinitis   . IC (irritable colon)   . Hearing loss   . Tremor   . Eczema   . HA (headache)   . Back pain   . Aortic valve disease   . Diverticulitis   . Chronic left hip pain   . Chronic low back pain   . PVD (peripheral vascular disease)    History reviewed. No pertinent past surgical history. History reviewed. No pertinent family history. History  Substance Use Topics  . Smoking status: Unknown If Ever Smoked  . Smokeless tobacco: Not on file  . Alcohol Use: No   OB History   Grav Para Term Preterm Abortions TAB SAB Ect Mult Living                 Review of Systems  Unable to perform ROS: Dementia    Allergies  Review of patient's allergies indicates no known allergies.  Home Medications   Current Outpatient Rx  Name  Route  Sig  Dispense  Refill  . alum & mag hydroxide-simeth (MAALOX/MYLANTA) 200-200-20  MG/5ML suspension   Oral   Take 15 mLs by mouth every 6 (six) hours as needed. For indigestion         . aspirin 81 MG chewable tablet   Oral   Chew 81 mg by mouth daily.         . Atorvastatin Calcium (LIPITOR PO)   Oral   Take 5 mg by mouth at bedtime.         . Cholecalciferol (VITAMIN D3) 1000 UNITS CAPS   Oral   Take 1 capsule by mouth daily.         . Cranberry 475 MG CAPS   Oral   Take 1 capsule by mouth 2 (two) times daily.         Marland Kitchen. docusate sodium (COLACE) 250 MG capsule   Oral   Take 250 mg by mouth daily.         Marland Kitchen. donepezil (ARICEPT) 10 MG tablet   Oral   Take 1 tablet (10 mg total) by mouth at bedtime.   30 tablet   0   . DULoxetine (CYMBALTA) 30 MG capsule   Oral   Take 30 mg by mouth daily. Take along with 60mg  capsule by mouth each day for a total of 90mg   by mouth daily.         . DULoxetine (CYMBALTA) 60 MG capsule   Oral   Take 60 mg by mouth daily. Take along with a 30 mg capsule for a total of 90mg  by mouth daily         . fluocinolone (VANOS) 0.01 % cream   Topical   Apply 1 application topically 2 (two) times daily as needed. To affected areas         . gabapentin (NEURONTIN) 300 MG capsule   Oral   Take 300 mg by mouth 2 (two) times daily.         Marland Kitchen HYDROcodone-acetaminophen (NORCO/VICODIN) 5-325 MG per tablet   Oral   Take 1 tablet by mouth every 4 (four) hours as needed for pain.         . magnesium hydroxide (MILK OF MAGNESIA) 400 MG/5ML suspension   Oral   Take 15 mLs by mouth daily as needed. For constipation         . omeprazole (PRILOSEC) 20 MG capsule   Oral   Take 20 mg by mouth daily.         Marland Kitchen oseltamivir (TAMIFLU) 75 MG capsule   Oral   Take 1 capsule (75 mg total) by mouth every 12 (twelve) hours.   10 capsule   0   . polyethylene glycol (MIRALAX / GLYCOLAX) packet   Oral   Take 17 g by mouth daily.   14 each   0   . EXPIRED: propranolol (INDERAL) 10 MG tablet   Oral   Take 1 tablet  (10 mg total) by mouth 3 (three) times daily.   90 tablet   0   . QUEtiapine (SEROQUEL) 25 MG tablet   Oral   Take 1 tablet (25 mg total) by mouth 2 (two) times daily.   30 tablet   0   . SUMAtriptan (IMITREX) 50 MG tablet   Oral   Take 50 mg by mouth every 2 (two) hours as needed. For headache         . topiramate (TOPAMAX) 50 MG tablet   Oral   Take 50 mg by mouth 3 (three) times daily.         . traMADol (ULTRAM) 50 MG tablet   Oral   Take 1 tablet (50 mg total) by mouth 4 (four) times daily as needed. For pain   30 tablet   0   . triamcinolone lotion (KENALOG) 0.1 %   Topical   Apply 1 application topically daily. Apply to red itchy area every day directed          BP 179/89  Pulse 72  Temp(Src) 98.3 F (36.8 C) (Oral)  Resp 18  SpO2 97% Physical Exam  Constitutional: She appears well-developed and well-nourished.  HENT:  Head: Normocephalic and atraumatic.  Eyes: Pupils are equal, round, and reactive to light.  Neck: Normal range of motion. Neck supple.  Cardiovascular: Normal rate, regular rhythm and normal heart sounds.   Pulmonary/Chest: Effort normal and breath sounds normal. No respiratory distress. She has no wheezes. She has no rales. She exhibits no tenderness.  Abdominal: Soft. Bowel sounds are normal. There is tenderness (Moderate tenderness to the left abdomen). There is no rebound and no guarding.  Musculoskeletal: Normal range of motion. She exhibits no edema.  Lymphadenopathy:    She has no cervical adenopathy.  Neurological: She is alert.  Alert and answers questions but is confused.  Skin: Skin  is warm and dry. No rash noted.  Psychiatric: She has a normal mood and affect.    ED Course  Procedures (including critical care time) Labs Review Results for orders placed during the hospital encounter of 09/07/13  CBC WITH DIFFERENTIAL      Result Value Range   WBC 8.8  4.0 - 10.5 K/uL   RBC 4.20  3.87 - 5.11 MIL/uL   Hemoglobin 12.9   12.0 - 15.0 g/dL   HCT 29.5  62.1 - 30.8 %   MCV 95.5  78.0 - 100.0 fL   MCH 30.7  26.0 - 34.0 pg   MCHC 32.2  30.0 - 36.0 g/dL   RDW 65.7  84.6 - 96.2 %   Platelets 359  150 - 400 K/uL   Neutrophils Relative % 59  43 - 77 %   Neutro Abs 5.2  1.7 - 7.7 K/uL   Lymphocytes Relative 28  12 - 46 %   Lymphs Abs 2.4  0.7 - 4.0 K/uL   Monocytes Relative 9  3 - 12 %   Monocytes Absolute 0.8  0.1 - 1.0 K/uL   Eosinophils Relative 5  0 - 5 %   Eosinophils Absolute 0.4  0.0 - 0.7 K/uL   Basophils Relative 0  0 - 1 %   Basophils Absolute 0.0  0.0 - 0.1 K/uL  COMPREHENSIVE METABOLIC PANEL      Result Value Range   Sodium 143  137 - 147 mEq/L   Potassium 4.3  3.7 - 5.3 mEq/L   Chloride 106  96 - 112 mEq/L   CO2 24  19 - 32 mEq/L   Glucose, Bld 98  70 - 99 mg/dL   BUN 15  6 - 23 mg/dL   Creatinine, Ser 9.52  0.50 - 1.10 mg/dL   Calcium 9.4  8.4 - 84.1 mg/dL   Total Protein 7.1  6.0 - 8.3 g/dL   Albumin 3.0 (*) 3.5 - 5.2 g/dL   AST 14  0 - 37 U/L   ALT 7  0 - 35 U/L   Alkaline Phosphatase 81  39 - 117 U/L   Total Bilirubin 0.3  0.3 - 1.2 mg/dL   GFR calc non Af Amer 46 (*) >90 mL/min   GFR calc Af Amer 54 (*) >90 mL/min  URINALYSIS, ROUTINE W REFLEX MICROSCOPIC      Result Value Range   Color, Urine YELLOW  YELLOW   APPearance CLEAR  CLEAR   Specific Gravity, Urine 1.014  1.005 - 1.030   pH 7.0  5.0 - 8.0   Glucose, UA NEGATIVE  NEGATIVE mg/dL   Hgb urine dipstick NEGATIVE  NEGATIVE   Bilirubin Urine NEGATIVE  NEGATIVE   Ketones, ur NEGATIVE  NEGATIVE mg/dL   Protein, ur NEGATIVE  NEGATIVE mg/dL   Urobilinogen, UA 0.2  0.0 - 1.0 mg/dL   Nitrite NEGATIVE  NEGATIVE   Leukocytes, UA NEGATIVE  NEGATIVE   Dg Chest 2 View  09/07/2013   CLINICAL DATA:  Cough.  Fatigue and weakness  EXAM: CHEST  2 VIEW  COMPARISON:  08/25/2013  FINDINGS: The heart size and mediastinal contours are within normal limits. Granuloma is again identified within the left upper lobe. Both lungs are clear. Chronic  right posterior lateral rib fracture deformities noted.  IMPRESSION: No active cardiopulmonary disease.   Electronically Signed   By: Signa Kell M.D.   On: 09/07/2013 13:00   Dg Chest 2 View  08/25/2013   CLINICAL  DATA:  Cough.  EXAM: CHEST  2 VIEW  COMPARISON:  March 30, 2013.  FINDINGS: The heart size and mediastinal contours are within normal limits. Both lungs are clear. No pleural effusion or pneumothorax is noted. Old right rib fractures are noted.  IMPRESSION: No active cardiopulmonary disease.   Electronically Signed   By: Roque Lias M.D.   On: 08/25/2013 13:02   Ct Abdomen Pelvis W Contrast  09/07/2013   CLINICAL DATA:  Left abdominal pain and tenderness  EXAM: CT ABDOMEN AND PELVIS WITH CONTRAST  TECHNIQUE: Multidetector CT imaging of the abdomen and pelvis was performed using the standard protocol following bolus administration of intravenous contrast.  CONTRAST:  50mL OMNIPAQUE IOHEXOL 300 MG/ML SOLN, 80mL OMNIPAQUE IOHEXOL 300 MG/ML SOLN  COMPARISON:  CT ABD/PELVIS W CM dated 02/11/2012  FINDINGS: Lung bases are clear.  No pericardial fluid.  Small hypodense lesion in the right hepatic lobe likely represents a small cyst. Gallbladder, pancreas, spleen, right adrenal gland normal. There is mild nodular thickening of the left adrenal gland (image 22) this is similar prior. Common bile duct is upper limits of normal at 7 mm for age. No enhancing renal cortical lesion. No ureterolithiasis. There is a calcification just inferior to the distal left ureter at the vesicoureteral junction measuring 3 mm. This is present on prior exam and felt to represent a vascular calcification.  The stomach, small bowel, and cecum are normal. There is a moderate volume of stool throughout the colon. The diverticula through the sigmoid colon.  Abdominal or is normal caliber. No retroperitoneal periportal lymphadenopathy. Patient status post aortic aneurysm repair.  No free fluid the pelvis. The bladder and uterus  are normal. The adnexa are normal. . No aggressive osseous lesion.  IMPRESSION: 1. Moderate volume stool throughout colon suggests constipation. 2. Mild dilatation common bile duct is within normal limits for age. 3. Mild thickening left adrenal gland likely represents hyperplasia.   Electronically Signed   By: Genevive Bi M.D.   On: 09/07/2013 15:27    Imaging Review Dg Chest 2 View  09/07/2013   CLINICAL DATA:  Cough.  Fatigue and weakness  EXAM: CHEST  2 VIEW  COMPARISON:  08/25/2013  FINDINGS: The heart size and mediastinal contours are within normal limits. Granuloma is again identified within the left upper lobe. Both lungs are clear. Chronic right posterior lateral rib fracture deformities noted.  IMPRESSION: No active cardiopulmonary disease.   Electronically Signed   By: Signa Kell M.D.   On: 09/07/2013 13:00   Ct Abdomen Pelvis W Contrast  09/07/2013   CLINICAL DATA:  Left abdominal pain and tenderness  EXAM: CT ABDOMEN AND PELVIS WITH CONTRAST  TECHNIQUE: Multidetector CT imaging of the abdomen and pelvis was performed using the standard protocol following bolus administration of intravenous contrast.  CONTRAST:  50mL OMNIPAQUE IOHEXOL 300 MG/ML SOLN, 80mL OMNIPAQUE IOHEXOL 300 MG/ML SOLN  COMPARISON:  CT ABD/PELVIS W CM dated 02/11/2012  FINDINGS: Lung bases are clear.  No pericardial fluid.  Small hypodense lesion in the right hepatic lobe likely represents a small cyst. Gallbladder, pancreas, spleen, right adrenal gland normal. There is mild nodular thickening of the left adrenal gland (image 22) this is similar prior. Common bile duct is upper limits of normal at 7 mm for age. No enhancing renal cortical lesion. No ureterolithiasis. There is a calcification just inferior to the distal left ureter at the vesicoureteral junction measuring 3 mm. This is present on prior exam and felt to  represent a vascular calcification.  The stomach, small bowel, and cecum are normal. There is a moderate  volume of stool throughout the colon. The diverticula through the sigmoid colon.  Abdominal or is normal caliber. No retroperitoneal periportal lymphadenopathy. Patient status post aortic aneurysm repair.  No free fluid the pelvis. The bladder and uterus are normal. The adnexa are normal. . No aggressive osseous lesion.  IMPRESSION: 1. Moderate volume stool throughout colon suggests constipation. 2. Mild dilatation common bile duct is within normal limits for age. 3. Mild thickening left adrenal gland likely represents hyperplasia.   Electronically Signed   By: Genevive Bi M.D.   On: 09/07/2013 15:27    EKG Interpretation   None       MDM   1. URI (upper respiratory infection)   2. Constipation    Patient presents with runny nose and congestion. She's afebrile here. Apparently there has been several flu cases out of nursing homes I did start her on Tamiflu. She was moderately tender on abdominal exam but CT scan does not show anything other than constipation. There's no other signs of infection. Her urinalysis is normal. She was discharged back to the nursing home with a prescription for Tamiflu and MiraLAX.    Rolan Bucco, MD 09/07/13 1537

## 2013-09-07 NOTE — ED Notes (Signed)
Pt in from local SNF dementia unit with c/o fatigue, weakness, cough, fever and generalized flu like symptoms. Pt recently on abx 08/29/2013

## 2013-09-07 NOTE — ED Notes (Signed)
MD at bedside. 

## 2013-09-07 NOTE — ED Notes (Signed)
PTAR has been notified  

## 2013-09-07 NOTE — Discharge Instructions (Signed)
Constipation, Adult Constipation is when a person has fewer than 3 bowel movements a week; has difficulty having a bowel movement; or has stools that are dry, hard, or larger than normal. As people grow older, constipation is more common. If you try to fix constipation with medicines that make you have a bowel movement (laxatives), the problem may get worse. Long-term laxative use may cause the muscles of the colon to become weak. A low-fiber diet, not taking in enough fluids, and taking certain medicines may make constipation worse. CAUSES   Certain medicines, such as antidepressants, pain medicine, iron supplements, antacids, and water pills.   Certain diseases, such as diabetes, irritable bowel syndrome (IBS), thyroid disease, or depression.   Not drinking enough water.   Not eating enough fiber-rich foods.   Stress or travel.  Lack of physical activity or exercise.  Not going to the restroom when there is the urge to have a bowel movement.  Ignoring the urge to have a bowel movement.  Using laxatives too much. SYMPTOMS   Having fewer than 3 bowel movements a week.   Straining to have a bowel movement.   Having hard, dry, or larger than normal stools.   Feeling full or bloated.   Pain in the lower abdomen.  Not feeling relief after having a bowel movement. DIAGNOSIS  Your caregiver will take a medical history and perform a physical exam. Further testing may be done for severe constipation. Some tests may include:   A barium enema X-ray to examine your rectum, colon, and sometimes, your small intestine.  A sigmoidoscopy to examine your lower colon.  A colonoscopy to examine your entire colon. TREATMENT  Treatment will depend on the severity of your constipation and what is causing it. Some dietary treatments include drinking more fluids and eating more fiber-rich foods. Lifestyle treatments may include regular exercise. If these diet and lifestyle recommendations  do not help, your caregiver may recommend taking over-the-counter laxative medicines to help you have bowel movements. Prescription medicines may be prescribed if over-the-counter medicines do not work.  HOME CARE INSTRUCTIONS   Increase dietary fiber in your diet, such as fruits, vegetables, whole grains, and beans. Limit high-fat and processed sugars in your diet, such as [place] fries, hamburgers, cookies, candies, and soda.   A fiber supplement may be added to your diet if you cannot get enough fiber from foods.   Drink enough fluids to keep your urine clear or pale yellow.   Exercise regularly or as directed by your caregiver.   Go to the restroom when you have the urge to go. Do not hold it.  Only take medicines as directed by your caregiver. Do not take other medicines for constipation without talking to your caregiver first. SEEK IMMEDIATE MEDICAL CARE IF:   You have bright red blood in your stool.   Your constipation lasts for more than 4 days or gets worse.   You have abdominal or rectal pain.   You have thin, pencil-like stools.  You have unexplained weight loss. MAKE SURE YOU:   Understand these instructions.  Will watch your condition.  Will get help right away if you are not doing well or get worse. Document Released: 04/27/2004 Document Revised: 10/22/2011 Document Reviewed: 05/11/2013 [facility] Patient Information 2014 [place], [place].  Upper Respiratory Infection, Adult An upper respiratory infection (URI) is also sometimes known as the common cold. The upper respiratory tract includes the nose, sinuses, throat, trachea, and bronchi. Bronchi are the airways leading to  the lungs. Most people improve within 1 week, but symptoms can last up to 2 weeks. A residual cough may last even longer.  CAUSES Many different viruses can infect the tissues lining the upper respiratory tract. The tissues become irritated and inflamed and often become very moist. Mucus  production is also common. A cold is contagious. You can easily spread the virus to others by oral contact. This includes kissing, sharing a glass, coughing, or sneezing. Touching your mouth or nose and then touching a surface, which is then touched by another person, can also spread the virus. SYMPTOMS  Symptoms typically develop 1 to 3 days after you come in contact with a cold virus. Symptoms vary from person to person. They may include:  Runny nose.  Sneezing.  Nasal congestion.  Sinus irritation.  Sore throat.  Loss of voice (laryngitis).  Cough.  Fatigue.  Muscle aches.  Loss of appetite.  Headache.  Low-grade fever. DIAGNOSIS  You might diagnose your own cold based on familiar symptoms, since most people get a cold 2 to 3 times a year. Your caregiver can confirm this based on your exam. Most importantly, your caregiver can check that your symptoms are not due to another disease such as strep throat, sinusitis, pneumonia, asthma, or epiglottitis. Blood tests, throat tests, and X-rays are not necessary to diagnose a common cold, but they may sometimes be helpful in excluding other more serious diseases. Your caregiver will decide if any further tests are required. RISKS AND COMPLICATIONS  You may be at risk for a more severe case of the common cold if you smoke cigarettes, have chronic heart disease (such as heart failure) or lung disease (such as asthma), or if you have a weakened immune system. The very young and very old are also at risk for more serious infections. Bacterial sinusitis, middle ear infections, and bacterial pneumonia can complicate the common cold. The common cold can worsen asthma and chronic obstructive pulmonary disease (COPD). Sometimes, these complications can require emergency medical care and may be life-threatening. PREVENTION  The best way to protect against getting a cold is to practice good hygiene. Avoid oral or hand contact with people with cold  symptoms. Wash your hands often if contact occurs. There is no clear evidence that vitamin C, vitamin E, echinacea, or exercise reduces the chance of developing a cold. However, it is always recommended to get plenty of rest and practice good nutrition. TREATMENT  Treatment is directed at relieving symptoms. There is no cure. Antibiotics are not effective, because the infection is caused by a virus, not by bacteria. Treatment may include:  Increased fluid intake. Sports drinks offer valuable electrolytes, sugars, and fluids.  Breathing heated mist or steam (vaporizer or shower).  Eating chicken soup or other clear broths, and maintaining good nutrition.  Getting plenty of rest.  Using gargles or lozenges for comfort.  Controlling fevers with ibuprofen or acetaminophen as directed by your caregiver.  Increasing usage of your inhaler if you have asthma. Zinc gel and zinc lozenges, taken in the first 24 hours of the common cold, can shorten the duration and lessen the severity of symptoms. Pain medicines may help with fever, muscle aches, and throat pain. A variety of non-prescription medicines are available to treat congestion and runny nose. Your caregiver can make recommendations and may suggest nasal or lung inhalers for other symptoms.  HOME CARE INSTRUCTIONS   Only take over-the-counter or prescription medicines for pain, discomfort, or fever as directed by your  caregiver.  Use a warm mist humidifier or inhale steam from a shower to increase air moisture. This may keep secretions moist and make it easier to breathe.  Drink enough water and fluids to keep your urine clear or pale yellow.  Rest as needed.  Return to work when your temperature has returned to normal or as your caregiver advises. You may need to stay home longer to avoid infecting others. You can also use a face mask and careful hand washing to prevent spread of the virus. SEEK MEDICAL CARE IF:   After the first few  days, you feel you are getting worse rather than better.  You need your caregiver's advice about medicines to control symptoms.  You develop chills, worsening shortness of breath, or brown or red sputum. These may be signs of pneumonia.  You develop yellow or brown nasal discharge or pain in the face, especially when you bend forward. These may be signs of sinusitis.  You develop a fever, swollen neck glands, pain with swallowing, or white areas in the back of your throat. These may be signs of strep throat. SEEK IMMEDIATE MEDICAL CARE IF:   You have a fever.  You develop severe or persistent headache, ear pain, sinus pain, or chest pain.  You develop wheezing, a prolonged cough, cough up blood, or have a change in your usual mucus (if you have chronic lung disease).  You develop sore muscles or a stiff neck. Document Released: 01/23/2001 Document Revised: 10/22/2011 Document Reviewed: 12/01/2010 [facility] Patient Information 2014 [place], [place].

## 2013-11-21 ENCOUNTER — Encounter (HOSPITAL_BASED_OUTPATIENT_CLINIC_OR_DEPARTMENT_OTHER): Payer: Self-pay | Admitting: Emergency Medicine

## 2013-11-21 ENCOUNTER — Emergency Department (HOSPITAL_BASED_OUTPATIENT_CLINIC_OR_DEPARTMENT_OTHER)
Admission: EM | Admit: 2013-11-21 | Discharge: 2013-11-21 | Disposition: A | Discharge status: Home / Self Care | Payer: Medicare Other | Attending: Emergency Medicine | Admitting: Emergency Medicine

## 2013-11-21 DIAGNOSIS — Z043 Encounter for examination and observation following other accident: Secondary | ICD-10-CM | POA: Insufficient documentation

## 2013-11-21 DIAGNOSIS — Z79899 Other long term (current) drug therapy: Secondary | ICD-10-CM | POA: Insufficient documentation

## 2013-11-21 DIAGNOSIS — Y939 Activity, unspecified: Secondary | ICD-10-CM | POA: Insufficient documentation

## 2013-11-21 DIAGNOSIS — Z8711 Personal history of peptic ulcer disease: Secondary | ICD-10-CM | POA: Insufficient documentation

## 2013-11-21 DIAGNOSIS — Z7982 Long term (current) use of aspirin: Secondary | ICD-10-CM | POA: Insufficient documentation

## 2013-11-21 DIAGNOSIS — G609 Hereditary and idiopathic neuropathy, unspecified: Secondary | ICD-10-CM | POA: Insufficient documentation

## 2013-11-21 DIAGNOSIS — Z711 Person with feared health complaint in whom no diagnosis is made: Secondary | ICD-10-CM | POA: Insufficient documentation

## 2013-11-21 DIAGNOSIS — Y9210 Unspecified residential institution as the place of occurrence of the external cause: Secondary | ICD-10-CM | POA: Insufficient documentation

## 2013-11-21 DIAGNOSIS — W19XXXA Unspecified fall, initial encounter: Secondary | ICD-10-CM | POA: Insufficient documentation

## 2013-11-21 DIAGNOSIS — Z872 Personal history of diseases of the skin and subcutaneous tissue: Secondary | ICD-10-CM | POA: Insufficient documentation

## 2013-11-21 DIAGNOSIS — IMO0002 Reserved for concepts with insufficient information to code with codable children: Secondary | ICD-10-CM | POA: Insufficient documentation

## 2013-11-21 DIAGNOSIS — H9190 Unspecified hearing loss, unspecified ear: Secondary | ICD-10-CM | POA: Insufficient documentation

## 2013-11-21 DIAGNOSIS — K5900 Constipation, unspecified: Secondary | ICD-10-CM | POA: Insufficient documentation

## 2013-11-21 DIAGNOSIS — F329 Major depressive disorder, single episode, unspecified: Secondary | ICD-10-CM | POA: Insufficient documentation

## 2013-11-21 DIAGNOSIS — F0390 Unspecified dementia without behavioral disturbance: Secondary | ICD-10-CM | POA: Insufficient documentation

## 2013-11-21 DIAGNOSIS — G8929 Other chronic pain: Secondary | ICD-10-CM | POA: Insufficient documentation

## 2013-11-21 DIAGNOSIS — Z0000 Encounter for general adult medical examination without abnormal findings: Secondary | ICD-10-CM

## 2013-11-21 DIAGNOSIS — I10 Essential (primary) hypertension: Secondary | ICD-10-CM | POA: Insufficient documentation

## 2013-11-21 DIAGNOSIS — F3289 Other specified depressive episodes: Secondary | ICD-10-CM | POA: Insufficient documentation

## 2013-11-21 NOTE — ED Provider Notes (Signed)
CSN: [id number]     Arrival date & time 11/21/13  1646 History   First MD Initiated Contact with Patient 11/21/13 1713     Chief Complaint  Patient presents with  . Fall     (Consider location/radiation/quality/duration/timing/severity/associated sxs/prior Treatment) Patient is a 80 y.o. female presenting with fall.  Fall This is a new problem. The current episode started today. Associated symptoms comments: Patient from NH with complaint per EMS of unwitnessed fall earlier today. No complaint of pain. No bleeding injury. No reports of changed mental status, vomiting, or recent illness. .    Past Medical History  Diagnosis Date  . Hypertension   . Depression   . Dementia   . Back pain   . Neuropathy     peripheral  . Constipation   . Eczema   . Peptic ulcer   . Peripheral neuropathy   . Aortic aneurysm   . Allergic rhinitis   . IC (irritable colon)   . Hearing loss   . Tremor   . Eczema   . HA (headache)   . Back pain   . Aortic valve disease   . Diverticulitis   . Chronic left hip pain   . Chronic low back pain   . PVD (peripheral vascular disease)    No past surgical history on file. No family history on file. History  Substance Use Topics  . Smoking status: Unknown If Ever Smoked  . Smokeless tobacco: Not on file  . Alcohol Use: No   OB History   Grav Para Term Preterm Abortions TAB SAB Ect Mult Living                 Review of Systems  Unable to perform ROS: Dementia      Allergies  Review of patient's allergies indicates no known allergies.  Home Medications   Current Outpatient Rx  Name  Route  Sig  Dispense  Refill  . alum & mag hydroxide-simeth (MAALOX/MYLANTA) 200-200-20 MG/5ML suspension   Oral   Take 15 mLs by mouth every 6 (six) hours as needed. For indigestion         . aspirin 81 MG chewable tablet   Oral   Chew 81 mg by mouth daily.         . Atorvastatin Calcium (LIPITOR PO)   Oral   Take 5 mg by mouth at bedtime.        . Cholecalciferol (VITAMIN D3) 1000 UNITS CAPS   Oral   Take 1 capsule by mouth daily.         . Cranberry 475 MG CAPS   Oral   Take 1 capsule by mouth 2 (two) times daily.         Marland Kitchen docusate sodium (COLACE) 250 MG capsule   Oral   Take 250 mg by mouth daily.         Marland Kitchen donepezil (ARICEPT) 10 MG tablet   Oral   Take 1 tablet (10 mg total) by mouth at bedtime.   30 tablet   0   . DULoxetine (CYMBALTA) 30 MG capsule   Oral   Take 30 mg by mouth daily. Take along with 60mg  capsule by mouth each day for a total of 90mg  by mouth daily.         . DULoxetine (CYMBALTA) 60 MG capsule   Oral   Take 60 mg by mouth daily. Take along with a 30 mg capsule for a total of 90mg  by mouth  daily         . fluocinolone (VANOS) 0.01 % cream   Topical   Apply 1 application topically 2 (two) times daily as needed. To affected areas         . gabapentin (NEURONTIN) 300 MG capsule   Oral   Take 300 mg by mouth 2 (two) times daily.         Marland Kitchen HYDROcodone-acetaminophen (NORCO/VICODIN) 5-325 MG per tablet   Oral   Take 1 tablet by mouth every 4 (four) hours as needed for pain.         . magnesium hydroxide (MILK OF MAGNESIA) 400 MG/5ML suspension   Oral   Take 15 mLs by mouth daily as needed. For constipation         . omeprazole (PRILOSEC) 20 MG capsule   Oral   Take 20 mg by mouth daily.         Marland Kitchen oseltamivir (TAMIFLU) 75 MG capsule   Oral   Take 1 capsule (75 mg total) by mouth every 12 (twelve) hours.   10 capsule   0   . polyethylene glycol (MIRALAX / GLYCOLAX) packet   Oral   Take 17 g by mouth daily.   14 each   0   . EXPIRED: propranolol (INDERAL) 10 MG tablet   Oral   Take 1 tablet (10 mg total) by mouth 3 (three) times daily.   90 tablet   0   . QUEtiapine (SEROQUEL) 25 MG tablet   Oral   Take 1 tablet (25 mg total) by mouth 2 (two) times daily.   30 tablet   0   . SUMAtriptan (IMITREX) 50 MG tablet   Oral   Take 50 mg by mouth every 2  (two) hours as needed. For headache         . topiramate (TOPAMAX) 50 MG tablet   Oral   Take 50 mg by mouth 3 (three) times daily.         . traMADol (ULTRAM) 50 MG tablet   Oral   Take 1 tablet (50 mg total) by mouth 4 (four) times daily as needed. For pain   30 tablet   0   . triamcinolone lotion (KENALOG) 0.1 %   Topical   Apply 1 application topically daily. Apply to red itchy area every day directed          BP 190/67  Pulse 70  Temp(Src) 98.2 F (36.8 C) (Oral)  Resp 18  SpO2 96% Physical Exam  Constitutional: She is oriented to person, place, and time. She appears well-developed and well-nourished. No distress.  Smiling, pleasant.   HENT:  Head: Normocephalic and atraumatic.  Eyes: Conjunctivae are normal.  Neck: Normal range of motion.  Cardiovascular: Normal rate.   No murmur heard. Pulmonary/Chest: Effort normal. She exhibits no tenderness.  Abdominal: Soft. There is no tenderness.  No bruising or evidence of trauma to abdominal wall.   Musculoskeletal: Normal range of motion.  Moves all extremities. She is ambulatory, fully weight bearing without obvious discomfort.   Neurological: She is alert and oriented to person, place, and time.  Skin: Skin is warm and dry.  No ecchymosis or traumatic skin lesion on body wide evaluation.    ED Course  Procedures (including critical care time) Labs Review Labs Reviewed - No data to display Imaging Review No results found.   EKG Interpretation None      MDM   Final diagnoses:  None  1. Fall 2. Normal physical exam  She is pleasantly demented. In NAD. Stable for discharge home to NH.    Arnoldo HookerShari A Willean Schurman, PA-C 11/21/13 1806

## 2013-11-21 NOTE — ED Notes (Signed)
Per ems, unwitnessed fall, no c/o pain

## 2013-11-21 NOTE — ED Provider Notes (Signed)
Medical screening examination/treatment/procedure(s) were performed by non-physician practitioner and as supervising physician I was immediately available for consultation/collaboration.   EKG Interpretation None       Ethelda ChickMartha K Linker, MD 11/21/13 [phone]

## 2013-11-21 NOTE — ED Notes (Signed)
[facility] called for transport.  

## 2013-11-21 NOTE — Discharge Instructions (Signed)
Fall Prevention and Home Safety °Falls cause injuries and can affect all age groups. It is possible to prevent falls.  °HOW TO PREVENT FALLS °· Wear shoes with rubber soles that do not have an opening for your toes. °· Keep the inside and outside of your house well lit. °· Use night lights throughout your home. °· Remove clutter from floors. °· Clean up floor spills. °· Remove throw rugs or fasten them to the floor with carpet tape. °· Do not place electrical cords across pathways. °· Put grab bars by your tub, shower, and toilet. Do not use towel bars as grab bars. °· Put handrails on both sides of the stairway. Fix loose handrails. °· Do not climb on stools or stepladders, if possible. °· Do not wax your floors. °· Repair uneven or unsafe sidewalks, walkways, or stairs. °· Keep items you use a lot within reach. °· Be aware of pets. °· Keep emergency numbers next to the telephone. °· Put smoke detectors in your home and near bedrooms. °Ask your doctor what other things you can do to prevent falls. °Document Released: 05/26/2009 Document Revised: 01/29/2012 Document Reviewed: 10/30/2011 °[employer]® Patient Information ©2014 [employer]. ° °

## 2013-12-02 ENCOUNTER — Emergency Department (HOSPITAL_BASED_OUTPATIENT_CLINIC_OR_DEPARTMENT_OTHER): Payer: Medicare Other

## 2013-12-02 ENCOUNTER — Encounter (HOSPITAL_BASED_OUTPATIENT_CLINIC_OR_DEPARTMENT_OTHER): Payer: Self-pay | Admitting: Emergency Medicine

## 2013-12-02 ENCOUNTER — Emergency Department (HOSPITAL_BASED_OUTPATIENT_CLINIC_OR_DEPARTMENT_OTHER)
Admission: EM | Admit: 2013-12-02 | Discharge: 2013-12-02 | Disposition: A | Discharge status: Home / Self Care | Payer: Medicare Other | Attending: Emergency Medicine | Admitting: Emergency Medicine

## 2013-12-02 DIAGNOSIS — G8929 Other chronic pain: Secondary | ICD-10-CM | POA: Insufficient documentation

## 2013-12-02 DIAGNOSIS — Y9210 Unspecified residential institution as the place of occurrence of the external cause: Secondary | ICD-10-CM | POA: Insufficient documentation

## 2013-12-02 DIAGNOSIS — H9190 Unspecified hearing loss, unspecified ear: Secondary | ICD-10-CM | POA: Insufficient documentation

## 2013-12-02 DIAGNOSIS — S60229A Contusion of unspecified hand, initial encounter: Secondary | ICD-10-CM | POA: Insufficient documentation

## 2013-12-02 DIAGNOSIS — K5900 Constipation, unspecified: Secondary | ICD-10-CM | POA: Insufficient documentation

## 2013-12-02 DIAGNOSIS — F0391 Unspecified dementia with behavioral disturbance: Secondary | ICD-10-CM | POA: Insufficient documentation

## 2013-12-02 DIAGNOSIS — Z9181 History of falling: Secondary | ICD-10-CM | POA: Insufficient documentation

## 2013-12-02 DIAGNOSIS — Z8711 Personal history of peptic ulcer disease: Secondary | ICD-10-CM | POA: Insufficient documentation

## 2013-12-02 DIAGNOSIS — F3289 Other specified depressive episodes: Secondary | ICD-10-CM | POA: Insufficient documentation

## 2013-12-02 DIAGNOSIS — F329 Major depressive disorder, single episode, unspecified: Secondary | ICD-10-CM | POA: Insufficient documentation

## 2013-12-02 DIAGNOSIS — W19XXXA Unspecified fall, initial encounter: Secondary | ICD-10-CM

## 2013-12-02 DIAGNOSIS — IMO0002 Reserved for concepts with insufficient information to code with codable children: Secondary | ICD-10-CM | POA: Insufficient documentation

## 2013-12-02 DIAGNOSIS — F03918 Unspecified dementia, unspecified severity, with other behavioral disturbance: Secondary | ICD-10-CM | POA: Insufficient documentation

## 2013-12-02 DIAGNOSIS — Z8719 Personal history of other diseases of the digestive system: Secondary | ICD-10-CM | POA: Insufficient documentation

## 2013-12-02 DIAGNOSIS — R296 Repeated falls: Secondary | ICD-10-CM | POA: Insufficient documentation

## 2013-12-02 DIAGNOSIS — G609 Hereditary and idiopathic neuropathy, unspecified: Secondary | ICD-10-CM | POA: Insufficient documentation

## 2013-12-02 DIAGNOSIS — Z79899 Other long term (current) drug therapy: Secondary | ICD-10-CM | POA: Insufficient documentation

## 2013-12-02 DIAGNOSIS — I10 Essential (primary) hypertension: Secondary | ICD-10-CM | POA: Insufficient documentation

## 2013-12-02 DIAGNOSIS — Z872 Personal history of diseases of the skin and subcutaneous tissue: Secondary | ICD-10-CM | POA: Insufficient documentation

## 2013-12-02 DIAGNOSIS — Y9389 Activity, other specified: Secondary | ICD-10-CM | POA: Insufficient documentation

## 2013-12-02 DIAGNOSIS — Z7982 Long term (current) use of aspirin: Secondary | ICD-10-CM | POA: Insufficient documentation

## 2013-12-02 NOTE — ED Notes (Signed)
[facility] [place] [facility]

## 2013-12-02 NOTE — Discharge Instructions (Signed)

## 2013-12-02 NOTE — ED Notes (Signed)
[facility] report-pt resident of [place]-unwitnessed fall-pt alert/confused/baseline-denies pain

## 2013-12-02 NOTE — ED Notes (Signed)
[facility] called for transport 

## 2013-12-02 NOTE — ED Provider Notes (Signed)
CSN: [id number]     Arrival date & time 12/02/13  1450 History   First MD Initiated Contact with Patient 12/02/13 1506     Chief Complaint  Patient presents with  . Fall     (Consider location/radiation/quality/duration/timing/severity/associated sxs/prior Treatment) HPI Comments: Patient presents via EMS after sustaining a fall. She lives at [facility]. I spoke with the staff at the nursing facility and they state that they left the room for a few minutes and then went back and she was on the floor. They think that she hit her head on a chair. She is at her baseline mental status. She's had no recent illnesses. Per the staff she's had 2 other falls this month. She does have a history of dementia and history is limited due to this. She hasn't been complaining of any other injuries.  Patient is a 80 y.o. female presenting with fall.  Fall    Past Medical History  Diagnosis Date  . Hypertension   . Depression   . Dementia   . Back pain   . Neuropathy     peripheral  . Constipation   . Eczema   . Peptic ulcer   . Peripheral neuropathy   . Aortic aneurysm   . Allergic rhinitis   . IC (irritable colon)   . Hearing loss   . Tremor   . Eczema   . HA (headache)   . Back pain   . Aortic valve disease   . Diverticulitis   . Chronic left hip pain   . Chronic low back pain   . PVD (peripheral vascular disease)    History reviewed. No pertinent past surgical history. No family history on file. History  Substance Use Topics  . Smoking status: Unknown If Ever Smoked  . Smokeless tobacco: Not on file  . Alcohol Use: No   OB History   Grav Para Term Preterm Abortions TAB SAB Ect Mult Living                 Review of Systems  Unable to perform ROS: Dementia      Allergies  Review of patient's allergies indicates no known allergies.  Home Medications   Prior to Admission medications   Medication Sig Start Date End Date Taking? Authorizing Provider   alum & mag hydroxide-simeth (MAALOX/MYLANTA) 200-200-20 MG/5ML suspension Take 15 mLs by mouth every 6 (six) hours as needed. For indigestion    Historical Provider, MD  aspirin 81 MG chewable tablet Chew 81 mg by mouth daily.    Historical Provider, MD  Atorvastatin Calcium (LIPITOR PO) Take 5 mg by mouth at bedtime.    Historical Provider, MD  Cholecalciferol (VITAMIN D3) 1000 UNITS CAPS Take 1 capsule by mouth daily.    Historical Provider, MD  Cranberry 475 MG CAPS Take 1 capsule by mouth 2 (two) times daily.    Historical Provider, MD  docusate sodium (COLACE) 250 MG capsule Take 250 mg by mouth daily.    Historical Provider, MD  donepezil (ARICEPT) 10 MG tablet Take 1 tablet (10 mg total) by mouth at bedtime. 02/12/12   Charm Rings, MD  DULoxetine (CYMBALTA) 30 MG capsule Take 30 mg by mouth daily. Take along with 60mg  capsule by mouth each day for a total of 90mg  by mouth daily.    Historical Provider, MD  DULoxetine (CYMBALTA) 60 MG capsule Take 60 mg by mouth daily. Take along with a 30 mg capsule for a total of  90mg  by mouth daily 02/12/12   Charm RingsErin J Honig, MD  fluocinolone (VANOS) 0.01 % cream Apply 1 application topically 2 (two) times daily as needed. To affected areas    Historical Provider, MD  gabapentin (NEURONTIN) 300 MG capsule Take 300 mg by mouth 2 (two) times daily. 02/12/12   Charm RingsErin J Honig, MD  HYDROcodone-acetaminophen (NORCO/VICODIN) 5-325 MG per tablet Take 1 tablet by mouth every 4 (four) hours as needed for pain.    Historical Provider, MD  magnesium hydroxide (MILK OF MAGNESIA) 400 MG/5ML suspension Take 15 mLs by mouth daily as needed. For constipation    Historical Provider, MD  omeprazole (PRILOSEC) 20 MG capsule Take 20 mg by mouth daily.    Historical Provider, MD  oseltamivir (TAMIFLU) 75 MG capsule Take 1 capsule (75 mg total) by mouth every 12 (twelve) hours. 09/07/13   Rolan BuccoMelanie Jazarah Capili, MD  polyethylene glycol (MIRALAX / GLYCOLAX) packet Take 17 g by mouth daily. 09/07/13    Rolan BuccoMelanie Solene Hereford, MD  propranolol (INDERAL) 10 MG tablet Take 1 tablet (10 mg total) by mouth 3 (three) times daily. 02/12/12 02/15/13  Charm RingsErin J Honig, MD  QUEtiapine (SEROQUEL) 25 MG tablet Take 1 tablet (25 mg total) by mouth 2 (two) times daily. 02/12/12   Charm RingsErin J Honig, MD  SUMAtriptan (IMITREX) 50 MG tablet Take 50 mg by mouth every 2 (two) hours as needed. For headache    Historical Provider, MD  topiramate (TOPAMAX) 50 MG tablet Take 50 mg by mouth 3 (three) times daily. 02/12/12   Charm RingsErin J Honig, MD  traMADol (ULTRAM) 50 MG tablet Take 1 tablet (50 mg total) by mouth 4 (four) times daily as needed. For pain 02/12/12   Charm RingsErin J Honig, MD  triamcinolone lotion (KENALOG) 0.1 % Apply 1 application topically daily. Apply to red itchy area every day directed    Historical Provider, MD   BP 194/61  Pulse 72  Temp(Src) 98.1 F (36.7 C) (Oral)  Resp 18  SpO2 99% Physical Exam  Constitutional: She appears well-developed and well-nourished.  HENT:  Head: Normocephalic and atraumatic.  No visible signs of head injury  Eyes: Pupils are equal, round, and reactive to light.  Neck: Normal range of motion. Neck supple.  No pain along the cervical thoracic or lumbosacral spine  Cardiovascular: Normal rate, regular rhythm and normal heart sounds.   Pulmonary/Chest: Effort normal and breath sounds normal. No respiratory distress. She has no wheezes. She has no rales. She exhibits no tenderness.  No signs of external trauma to the chest or abdomen  Abdominal: Soft. Bowel sounds are normal. There is no tenderness. There is no rebound and no guarding.  Musculoskeletal: Normal range of motion. She exhibits no edema.  She has ecchymosis and tenderness to the dorsum of the left hand. No deformity or swelling is noted. There is no other pain on palpation or range of motion of the extremities including the hips.  Lymphadenopathy:    She has no cervical adenopathy.  Neurological: She is alert.  Confused, moving all  extremities symmetrically. She did not follow commands. There is no facial drooping or aphasia.  Skin: Skin is warm and dry. No rash noted.  Psychiatric: She has a normal mood and affect.    ED Course  Procedures (including critical care time) Labs Review Labs Reviewed - No data to display  Imaging Review Ct Head Wo Contrast  12/02/2013   CLINICAL DATA:  Status post fall with a blow to the head and neck.  EXAM: CT HEAD WITHOUT CONTRAST  CT CERVICAL SPINE WITHOUT CONTRAST  TECHNIQUE: Multidetector CT imaging of the head and cervical spine was performed following the standard protocol without intravenous contrast. Multiplanar CT image reconstructions of the cervical spine were also generated.  COMPARISON:  Head CT scan 03/30/2013. Head and cervical spine CT scan 02/15/2013.  FINDINGS: CT HEAD FINDINGS  Cortical atrophy and chronic microvascular ischemic change are again seen. There is no evidence of acute abnormality including infarct, hemorrhage, midline shift or abnormal extra-axial fluid collection. There is no hydrocephalus or pneumocephalus. The calvarium is intact. Small mucous retention cysts or polyps are seen in the left maxillary sinus.  CT CERVICAL SPINE FINDINGS  There is no fracture or malalignment of the cervical spine. Degenerative disease from C5-7 is again seen and stable in appearance. Paraspinous structures are unremarkable. Lung apices are clear.  IMPRESSION: No acute finding head or cervical spine.  Atrophy and chronic microvascular ischemic change, stable in appearance.  C5-7 degenerative disc disease, unchanged.   Electronically Signed   By: Drusilla Kanner M.D.   On: 12/02/2013 16:52   Ct Cervical Spine Wo Contrast  12/02/2013   CLINICAL DATA:  Status post fall with a blow to the head and neck.  EXAM: CT HEAD WITHOUT CONTRAST  CT CERVICAL SPINE WITHOUT CONTRAST  TECHNIQUE: Multidetector CT imaging of the head and cervical spine was performed following the standard protocol  without intravenous contrast. Multiplanar CT image reconstructions of the cervical spine were also generated.  COMPARISON:  Head CT scan 03/30/2013. Head and cervical spine CT scan 02/15/2013.  FINDINGS: CT HEAD FINDINGS  Cortical atrophy and chronic microvascular ischemic change are again seen. There is no evidence of acute abnormality including infarct, hemorrhage, midline shift or abnormal extra-axial fluid collection. There is no hydrocephalus or pneumocephalus. The calvarium is intact. Small mucous retention cysts or polyps are seen in the left maxillary sinus.  CT CERVICAL SPINE FINDINGS  There is no fracture or malalignment of the cervical spine. Degenerative disease from C5-7 is again seen and stable in appearance. Paraspinous structures are unremarkable. Lung apices are clear.  IMPRESSION: No acute finding head or cervical spine.  Atrophy and chronic microvascular ischemic change, stable in appearance.  C5-7 degenerative disc disease, unchanged.   Electronically Signed   By: Drusilla Kanner M.D.   On: 12/02/2013 16:52   Dg Hand Complete Left  12/02/2013   CLINICAL DATA:  Fall at home, left hand bruising  EXAM: LEFT HAND - COMPLETE 3+ VIEW  COMPARISON:  None.  FINDINGS: There is no evidence of fracture or dislocation. There is generalized osteopenia. There is mild osteoarthritis of the first carpometacarpal joint. Soft tissues are unremarkable.  IMPRESSION: No acute osseous injury of the left hand.   Electronically Signed   By: Elige Ko   On: 12/02/2013 15:39     EKG Interpretation   Date/Time:  Wednesday December 02 2013 15:23:30 EDT Ventricular Rate:  71 PR Interval:  146 QRS Duration: 92 QT Interval:  428 QTC Calculation: 465 R Axis:   43 Text Interpretation:  Normal sinus rhythm Cannot rule out Inferior infarct  , age undetermined Abnormal ECG since last tracing no significant change  Confirmed by Lindsea Olivar  MD, Katianne Barre ([id number]) on 12/02/2013 3:56:04 PM      MDM   Final diagnoses:   Fall   Patient presents status post a fall. The only tenderness that I can find on exam his to her left hand that x-rays do not reveal  any underlying fracture. She is alert but confused which is apparently her baseline mental status per staff at her nursing facility. Per report, she has a history of multiple falls in the recent past. At this point without change in mental status or other reports of recent illness, I don't feel that further labs or imaging studies are indicated. There was a question of whether or not she hit her head side do a scan of her head and cervical spine which were negative for acute injuries.   Rolan BuccoMelanie Ziare Orrick, MD 12/02/13 [phone]

## 2014-03-07 ENCOUNTER — Encounter (HOSPITAL_BASED_OUTPATIENT_CLINIC_OR_DEPARTMENT_OTHER): Payer: Self-pay | Admitting: Emergency Medicine

## 2014-03-07 ENCOUNTER — Emergency Department (HOSPITAL_BASED_OUTPATIENT_CLINIC_OR_DEPARTMENT_OTHER)
Admission: EM | Admit: 2014-03-07 | Discharge: 2014-03-07 | Disposition: A | Discharge status: Home / Self Care | Payer: Medicare Other | Attending: Emergency Medicine | Admitting: Emergency Medicine

## 2014-03-07 ENCOUNTER — Emergency Department (HOSPITAL_BASED_OUTPATIENT_CLINIC_OR_DEPARTMENT_OTHER): Payer: Medicare Other

## 2014-03-07 DIAGNOSIS — I10 Essential (primary) hypertension: Secondary | ICD-10-CM | POA: Diagnosis not present

## 2014-03-07 DIAGNOSIS — Y9210 Unspecified residential institution as the place of occurrence of the external cause: Secondary | ICD-10-CM | POA: Insufficient documentation

## 2014-03-07 DIAGNOSIS — Z79899 Other long term (current) drug therapy: Secondary | ICD-10-CM | POA: Insufficient documentation

## 2014-03-07 DIAGNOSIS — Z043 Encounter for examination and observation following other accident: Secondary | ICD-10-CM | POA: Diagnosis present

## 2014-03-07 DIAGNOSIS — IMO0002 Reserved for concepts with insufficient information to code with codable children: Secondary | ICD-10-CM | POA: Insufficient documentation

## 2014-03-07 DIAGNOSIS — Z0000 Encounter for general adult medical examination without abnormal findings: Secondary | ICD-10-CM

## 2014-03-07 DIAGNOSIS — F0390 Unspecified dementia without behavioral disturbance: Secondary | ICD-10-CM | POA: Diagnosis not present

## 2014-03-07 DIAGNOSIS — Z8711 Personal history of peptic ulcer disease: Secondary | ICD-10-CM | POA: Insufficient documentation

## 2014-03-07 DIAGNOSIS — G8929 Other chronic pain: Secondary | ICD-10-CM | POA: Diagnosis not present

## 2014-03-07 DIAGNOSIS — G609 Hereditary and idiopathic neuropathy, unspecified: Secondary | ICD-10-CM | POA: Diagnosis not present

## 2014-03-07 DIAGNOSIS — Z7982 Long term (current) use of aspirin: Secondary | ICD-10-CM | POA: Diagnosis not present

## 2014-03-07 DIAGNOSIS — Z8709 Personal history of other diseases of the respiratory system: Secondary | ICD-10-CM | POA: Insufficient documentation

## 2014-03-07 DIAGNOSIS — F3289 Other specified depressive episodes: Secondary | ICD-10-CM | POA: Insufficient documentation

## 2014-03-07 DIAGNOSIS — R296 Repeated falls: Secondary | ICD-10-CM | POA: Diagnosis not present

## 2014-03-07 DIAGNOSIS — F329 Major depressive disorder, single episode, unspecified: Secondary | ICD-10-CM | POA: Diagnosis not present

## 2014-03-07 DIAGNOSIS — Z008 Encounter for other general examination: Secondary | ICD-10-CM | POA: Insufficient documentation

## 2014-03-07 DIAGNOSIS — Z872 Personal history of diseases of the skin and subcutaneous tissue: Secondary | ICD-10-CM | POA: Diagnosis not present

## 2014-03-07 DIAGNOSIS — Y9389 Activity, other specified: Secondary | ICD-10-CM | POA: Insufficient documentation

## 2014-03-07 DIAGNOSIS — H9190 Unspecified hearing loss, unspecified ear: Secondary | ICD-10-CM | POA: Diagnosis not present

## 2014-03-07 DIAGNOSIS — W19XXXA Unspecified fall, initial encounter: Secondary | ICD-10-CM

## 2014-03-07 NOTE — Discharge Instructions (Signed)
Fall Prevention and Home Safety °Falls cause injuries and can affect all age groups. It is possible to prevent falls.  °HOW TO PREVENT FALLS °· Wear shoes with rubber soles that do not have an opening for your toes. °· Keep the inside and outside of your house well lit. °· Use night lights throughout your home. °· Remove clutter from floors. °· Clean up floor spills. °· Remove throw rugs or fasten them to the floor with carpet tape. °· Do not place electrical cords across pathways. °· Put grab bars by your tub, shower, and toilet. Do not use towel bars as grab bars. °· Put handrails on both sides of the stairway. Fix loose handrails. °· Do not climb on stools or stepladders, if possible. °· Do not wax your floors. °· Repair uneven or unsafe sidewalks, walkways, or stairs. °· Keep items you use a lot within reach. °· Be aware of pets. °· Keep emergency numbers next to the telephone. °· Put smoke detectors in your home and near bedrooms. °Ask your doctor what other things you can do to prevent falls. °Document Released: 05/26/2009 Document Revised: 01/29/2012 Document Reviewed: 10/30/2011 °[employer]® Patient Information ©2015 [employer]. This information is not intended to replace advice given to you by your health care provider. Make sure you discuss any questions you have with your health care provider. ° °

## 2014-03-07 NOTE — ED Notes (Signed)
Received report from previous Nurse. Previous RN had called report to [facility]. This patient is upon discharge already. [facility] is at bedside right now transporting. Vital sign retaken and BP notified to the PA-C.

## 2014-03-07 NOTE — ED Provider Notes (Signed)
CSN: [id number]     Arrival date & time 03/07/14  1148 History   First MD Initiated Contact with Patient 03/07/14 1216     Chief Complaint  Patient presents with  . Fall     (Consider location/radiation/quality/duration/timing/severity/associated sxs/prior Treatment) Patient is a 80 y.o. female presenting with fall. The history is provided by the nursing home and the EMS personnel. No language interpreter was used.  Fall This is a new problem. The current episode started today. Associated symptoms comments: Patient familiar to the ED had unwitnessed fall while at her nursing home of residence. Per nursing home reports given to EMS, she was found to have a posterior scalp hematoma. No reported LOC, vomiting or change in baseline demented mental status..    Past Medical History  Diagnosis Date  . Hypertension   . Depression   . Dementia   . Back pain   . Neuropathy     peripheral  . Constipation   . Eczema   . Peptic ulcer   . Peripheral neuropathy   . Aortic aneurysm   . Allergic rhinitis   . IC (irritable colon)   . Hearing loss   . Tremor   . Eczema   . HA (headache)   . Back pain   . Aortic valve disease   . Diverticulitis   . Chronic left hip pain   . Chronic low back pain   . PVD (peripheral vascular disease)    History reviewed. No pertinent past surgical history. No family history on file. History  Substance Use Topics  . Smoking status: Unknown If Ever Smoked  . Smokeless tobacco: Not on file  . Alcohol Use: No   OB History   Grav Para Term Preterm Abortions TAB SAB Ect Mult Living                 Review of Systems  Unable to perform ROS: Dementia      Allergies  Review of patient's allergies indicates no known allergies.  Home Medications   Prior to Admission medications   Medication Sig Start Date End Date Taking? Authorizing Provider  alum & mag hydroxide-simeth (MAALOX/MYLANTA) 200-200-20 MG/5ML suspension Take 15 mLs by mouth every 6  (six) hours as needed. For indigestion    Historical Provider, MD  aspirin 81 MG chewable tablet Chew 81 mg by mouth daily.    Historical Provider, MD  Atorvastatin Calcium (LIPITOR PO) Take 5 mg by mouth at bedtime.    Historical Provider, MD  Cholecalciferol (VITAMIN D3) 1000 UNITS CAPS Take 1 capsule by mouth daily.    Historical Provider, MD  Cranberry 475 MG CAPS Take 1 capsule by mouth 2 (two) times daily.    Historical Provider, MD  docusate sodium (COLACE) 250 MG capsule Take 250 mg by mouth daily.    Historical Provider, MD  donepezil (ARICEPT) 10 MG tablet Take 1 tablet (10 mg total) by mouth at bedtime. 02/12/12   Charm RingsErin J Honig, MD  DULoxetine (CYMBALTA) 30 MG capsule Take 30 mg by mouth daily. Take along with 60mg  capsule by mouth each day for a total of 90mg  by mouth daily.    Historical Provider, MD  DULoxetine (CYMBALTA) 60 MG capsule Take 60 mg by mouth daily. Take along with a 30 mg capsule for a total of 90mg  by mouth daily 02/12/12   Charm RingsErin J Honig, MD  fluocinolone (VANOS) 0.01 % cream Apply 1 application topically 2 (two) times daily as needed. To affected areas  Historical Provider, MD  gabapentin (NEURONTIN) 300 MG capsule Take 300 mg by mouth 2 (two) times daily. 02/12/12   Charm Rings, MD  HYDROcodone-acetaminophen (NORCO/VICODIN) 5-325 MG per tablet Take 1 tablet by mouth every 4 (four) hours as needed for pain.    Historical Provider, MD  magnesium hydroxide (MILK OF MAGNESIA) 400 MG/5ML suspension Take 15 mLs by mouth daily as needed. For constipation    Historical Provider, MD  omeprazole (PRILOSEC) 20 MG capsule Take 20 mg by mouth daily.    Historical Provider, MD  oseltamivir (TAMIFLU) 75 MG capsule Take 1 capsule (75 mg total) by mouth every 12 (twelve) hours. 09/07/13   Rolan Bucco, MD  polyethylene glycol (MIRALAX / GLYCOLAX) packet Take 17 g by mouth daily. 09/07/13   Rolan Bucco, MD  propranolol (INDERAL) 10 MG tablet Take 1 tablet (10 mg total) by mouth 3 (three)  times daily. 02/12/12 02/15/13  Charm Rings, MD  QUEtiapine (SEROQUEL) 25 MG tablet Take 1 tablet (25 mg total) by mouth 2 (two) times daily. 02/12/12   Charm Rings, MD  SUMAtriptan (IMITREX) 50 MG tablet Take 50 mg by mouth every 2 (two) hours as needed. For headache    Historical Provider, MD  topiramate (TOPAMAX) 50 MG tablet Take 50 mg by mouth 3 (three) times daily. 02/12/12   Charm Rings, MD  traMADol (ULTRAM) 50 MG tablet Take 1 tablet (50 mg total) by mouth 4 (four) times daily as needed. For pain 02/12/12   Charm Rings, MD  triamcinolone lotion (KENALOG) 0.1 % Apply 1 application topically daily. Apply to red itchy area every day directed    Historical Provider, MD   BP 214/88  Pulse 73  Temp(Src) 98.1 F (36.7 C) (Oral)  Resp 20  SpO2 94% Physical Exam  Constitutional: She appears well-developed and well-nourished. No distress.  Awake, happy, smiling.  HENT:  Head: Atraumatic.  No scalp swelling, abrasion, bleeding or wound.  Pulmonary/Chest: She exhibits no tenderness.  Abdominal: There is no tenderness.  Musculoskeletal: Normal range of motion.  No abrasion, swelling or bony deformity to extremities. FROM.  Neurological: She is alert. Coordination normal.  Follows commands. NAD.  Skin: Skin is warm and dry.    ED Course  Procedures (including critical care time) Labs Review Labs Reviewed - No data to display  Imaging Review No results found.   EKG Interpretation None      MDM   Final diagnoses:  None    1. Fall, unwitnessed 2. Normal physical exam  She is at baseline mental status. No scalp wound or evidence head injury. She is moving neck well without distress. Ambulatory with usual assistance. Stable for discharge.     Arnoldo Hooker, PA-C 03/07/14 1321

## 2014-03-07 NOTE — ED Notes (Signed)
Pt ambulating out of room and back with two staff assist.  Pt tolerated well

## 2014-03-07 NOTE — ED Notes (Signed)
EMS reports pt found on the floor in the dining hall of her SNF. Pt has a hematoma at the base of her skull. EMS reports finding no other sign of injury.

## 2014-03-08 NOTE — ED Provider Notes (Signed)
Medical screening examination/treatment/procedure(s) were conducted as a shared visit with non-physician practitioner(s) and myself.  I personally evaluated the patient during the encounter.  Dg Cervical Spine Complete  03/07/2014   CLINICAL DATA:  History of fall complaining of neck pain.  EXAM: CERVICAL SPINE  4+ VIEWS  COMPARISON:  No priors.  FINDINGS: Multiple views of the cervical spine demonstrate no acute displaced fractures. Alignment is anatomic. Prevertebral soft tissues are normal. Multilevel degenerative disc disease, most severe at C5-C6 and C6-C7. Mild multilevel facet arthropathy.  IMPRESSION: 1. No acute radiographic abnormality of the cervical spine. 2. Multilevel degenerative disc disease and cervical spondylosis, as above.   Electronically Signed   By: Trudie Reedaniel  Entrikin M.D.   On: 03/07/2014 13:25   Pt is demented.  Alert in no distress.  No findings to suggest injury noted on exam.  Linwood DibblesJon Annabeth Tortora, MD 03/08/14 [phone]

## 2014-07-08 ENCOUNTER — Emergency Department (HOSPITAL_BASED_OUTPATIENT_CLINIC_OR_DEPARTMENT_OTHER): Payer: Medicare Other

## 2014-07-08 ENCOUNTER — Encounter (HOSPITAL_BASED_OUTPATIENT_CLINIC_OR_DEPARTMENT_OTHER): Payer: Self-pay | Admitting: *Deleted

## 2014-07-08 ENCOUNTER — Emergency Department (HOSPITAL_BASED_OUTPATIENT_CLINIC_OR_DEPARTMENT_OTHER)
Admission: EM | Admit: 2014-07-08 | Discharge: 2014-07-09 | Disposition: A | Discharge status: Home / Self Care | Payer: Medicare Other | Attending: Emergency Medicine | Admitting: Emergency Medicine

## 2014-07-08 DIAGNOSIS — S199XXA Unspecified injury of neck, initial encounter: Secondary | ICD-10-CM | POA: Diagnosis present

## 2014-07-08 DIAGNOSIS — Z872 Personal history of diseases of the skin and subcutaneous tissue: Secondary | ICD-10-CM | POA: Diagnosis not present

## 2014-07-08 DIAGNOSIS — Y92129 Unspecified place in nursing home as the place of occurrence of the external cause: Secondary | ICD-10-CM | POA: Insufficient documentation

## 2014-07-08 DIAGNOSIS — W1839XA Other fall on same level, initial encounter: Secondary | ICD-10-CM | POA: Insufficient documentation

## 2014-07-08 DIAGNOSIS — Z79899 Other long term (current) drug therapy: Secondary | ICD-10-CM | POA: Insufficient documentation

## 2014-07-08 DIAGNOSIS — I10 Essential (primary) hypertension: Secondary | ICD-10-CM | POA: Insufficient documentation

## 2014-07-08 DIAGNOSIS — W19XXXA Unspecified fall, initial encounter: Secondary | ICD-10-CM

## 2014-07-08 DIAGNOSIS — Z7982 Long term (current) use of aspirin: Secondary | ICD-10-CM | POA: Insufficient documentation

## 2014-07-08 DIAGNOSIS — G8929 Other chronic pain: Secondary | ICD-10-CM | POA: Insufficient documentation

## 2014-07-08 DIAGNOSIS — Y998 Other external cause status: Secondary | ICD-10-CM | POA: Insufficient documentation

## 2014-07-08 DIAGNOSIS — Z7952 Long term (current) use of systemic steroids: Secondary | ICD-10-CM | POA: Diagnosis not present

## 2014-07-08 DIAGNOSIS — Y9389 Activity, other specified: Secondary | ICD-10-CM | POA: Diagnosis not present

## 2014-07-08 DIAGNOSIS — Z8719 Personal history of other diseases of the digestive system: Secondary | ICD-10-CM | POA: Diagnosis not present

## 2014-07-08 DIAGNOSIS — F0390 Unspecified dementia without behavioral disturbance: Secondary | ICD-10-CM | POA: Insufficient documentation

## 2014-07-08 NOTE — ED Notes (Signed)
MD at bedside. 

## 2014-07-08 NOTE — ED Notes (Signed)
[facility] to be called for pt. Transport.

## 2014-07-08 NOTE — ED Notes (Addendum)
Per EMS report pt is a resident at [facility]. Witnessed fall by family per EMS report. States she tripped and fell. Pt has a hx of dementia but was c/o of her neck hurting. Pt arrived in c-collar. No other obvious injuries.  EMS reports pt is HTN

## 2014-07-08 NOTE — ED Provider Notes (Signed)
CSN: [id number]     Arrival date & time 07/08/14  1943 History  This chart was scribed for Glynn OctaveStephen Natasia Sanko, MD by Evon Slackerrance Branch, ED Scribe. This patient was seen in room MH12/MH12 and the patient's care was started at 8:03 PM.     Chief Complaint  Patient presents with  . Fall   Patient is a 80 y.o. female presenting with fall. The history is provided by the EMS personnel. No language interpreter was used.  Fall    Level 5 Caveat: Dementia HPI Comments: Sarah CurryMona Alvarado is a 80 y.o. female with PMHx of dementia brought in by ambulance, who presents to the Emergency Department complaining of fall onset PTA. EMS states that family witnessed the fall. EMS states that family said she tripped and fell. EMS states that she was complaining of neck pain on arrival. No other obvious injuries noted.    Past Medical History  Diagnosis Date  . Hypertension   . Depression   . Dementia   . Back pain   . Neuropathy     peripheral  . Constipation   . Eczema   . Peptic ulcer   . Peripheral neuropathy   . Aortic aneurysm   . Allergic rhinitis   . IC (irritable colon)   . Hearing loss   . Tremor   . Eczema   . HA (headache)   . Back pain   . Aortic valve disease   . Diverticulitis   . Chronic left hip pain   . Chronic low back pain   . PVD (peripheral vascular disease)    Past Surgical History  Procedure Laterality Date  .  unable to obtain due to dementia history      No family history on file. History  Substance Use Topics  . Smoking status: Unknown If Ever Smoked  . Smokeless tobacco: Not on file  . Alcohol Use: No   OB History    No data available     Review of Systems  Unable to perform ROS: Dementia      Allergies  Review of patient's allergies indicates no known allergies.  Home Medications   Prior to Admission medications   Medication Sig Start Date End Date Taking? Authorizing Provider  alum & mag hydroxide-simeth (MAALOX/MYLANTA) 200-200-20 MG/5ML  suspension Take 15 mLs by mouth every 6 (six) hours as needed. For indigestion    Historical Provider, MD  aspirin 81 MG chewable tablet Chew 81 mg by mouth daily.    Historical Provider, MD  Atorvastatin Calcium (LIPITOR PO) Take 5 mg by mouth at bedtime.    Historical Provider, MD  Cholecalciferol (VITAMIN D3) 1000 UNITS CAPS Take 1 capsule by mouth daily.    Historical Provider, MD  Cranberry 475 MG CAPS Take 1 capsule by mouth 2 (two) times daily.    Historical Provider, MD  docusate sodium (COLACE) 250 MG capsule Take 250 mg by mouth daily.    Historical Provider, MD  donepezil (ARICEPT) 10 MG tablet Take 1 tablet (10 mg total) by mouth at bedtime. 02/12/12   Charm RingsErin J Honig, MD  DULoxetine (CYMBALTA) 30 MG capsule Take 30 mg by mouth daily. Take along with 60mg  capsule by mouth each day for a total of 90mg  by mouth daily.    Historical Provider, MD  DULoxetine (CYMBALTA) 60 MG capsule Take 60 mg by mouth daily. Take along with a 30 mg capsule for a total of 90mg  by mouth daily 02/12/12   Charm RingsErin J Honig, MD  fluocinolone (VANOS) 0.01 % cream Apply 1 application topically 2 (two) times daily as needed. To affected areas    Historical Provider, MD  gabapentin (NEURONTIN) 300 MG capsule Take 300 mg by mouth 2 (two) times daily. 02/12/12   Charm Rings, MD  HYDROcodone-acetaminophen (NORCO/VICODIN) 5-325 MG per tablet Take 1 tablet by mouth every 4 (four) hours as needed for pain.    Historical Provider, MD  magnesium hydroxide (MILK OF MAGNESIA) 400 MG/5ML suspension Take 15 mLs by mouth daily as needed. For constipation    Historical Provider, MD  omeprazole (PRILOSEC) 20 MG capsule Take 20 mg by mouth daily.    Historical Provider, MD  oseltamivir (TAMIFLU) 75 MG capsule Take 1 capsule (75 mg total) by mouth every 12 (twelve) hours. 09/07/13   Rolan Bucco, MD  polyethylene glycol (MIRALAX / GLYCOLAX) packet Take 17 g by mouth daily. 09/07/13   Rolan Bucco, MD  propranolol (INDERAL) 10 MG tablet Take 1  tablet (10 mg total) by mouth 3 (three) times daily. 02/12/12 02/15/13  Charm Rings, MD  QUEtiapine (SEROQUEL) 25 MG tablet Take 1 tablet (25 mg total) by mouth 2 (two) times daily. Patient taking differently: Take 50 mg by mouth at bedtime.  02/12/12   Charm Rings, MD  SUMAtriptan (IMITREX) 50 MG tablet Take 50 mg by mouth every 2 (two) hours as needed. For headache    Historical Provider, MD  topiramate (TOPAMAX) 50 MG tablet Take 50 mg by mouth 3 (three) times daily. 02/12/12   Charm Rings, MD  traMADol (ULTRAM) 50 MG tablet Take 1 tablet (50 mg total) by mouth 4 (four) times daily as needed. For pain 02/12/12   Charm Rings, MD  triamcinolone lotion (KENALOG) 0.1 % Apply 1 application topically daily. Apply to red itchy area every day directed    Historical Provider, MD   Triage Vitals: BP 206/76 mmHg  Pulse 81  Temp(Src) 98.3 F (36.8 C)  Resp 20  SpO2 100%  Physical Exam  Constitutional: She appears well-developed and well-nourished. No distress.  Non verbal doesn't follow commands  HENT:  Head: Normocephalic and atraumatic.  Mouth/Throat: Oropharynx is clear and moist. No oropharyngeal exudate.  Scalp shows no hematoma or abrasions   Eyes: Conjunctivae and EOM are normal. Pupils are equal, round, and reactive to light.  Neck: Normal range of motion. Neck supple.  No C spine tenderness  Cardiovascular: Normal rate, regular rhythm and intact distal pulses.   Murmur heard. Pulmonary/Chest: Effort normal and breath sounds normal. No respiratory distress.  Abdominal: Soft. There is no tenderness. There is no rebound and no guarding.  Musculoskeletal: Normal range of motion. She exhibits no edema or tenderness.  No c-spine tenderness, no thoracic or lumbar tenderness FROM hips without pain.   Neurological:  Moving all extremities  Skin: Skin is warm.  Psychiatric: She has a normal mood and affect. Her behavior is normal.  Nursing note and vitals reviewed.   ED Course  Procedures  (including critical care time) DIAGNOSTIC STUDIES: Oxygen Saturation is 100% on RA, normal by my interpretation.    COORDINATION OF CARE:    Labs Review Labs Reviewed - No data to display  Imaging Review Ct Head Wo Contrast  07/08/2014   CLINICAL DATA:  Patient fell with pain after fall  EXAM: CT HEAD WITHOUT CONTRAST  CT CERVICAL SPINE WITHOUT CONTRAST  TECHNIQUE: Multidetector CT imaging of the head and cervical spine was performed following the standard protocol without intravenous  contrast. Multiplanar CT image reconstructions of the cervical spine were also generated.  COMPARISON:  Head CT January 19, 2014; cervical spine CT December 02, 2013  FINDINGS: CT HEAD FINDINGS  There is moderate diffuse atrophy. There is no mass, hemorrhage, extra-axial fluid collection, or midline shift. There is small vessel disease, patchy, throughout the centra semiovale bilaterally, stable. There is no new gray-white compartment lesion. No acute infarct apparent. The bony calvarium appears intact. The mastoid air cells are clear. There are small retention cysts in the left maxillary antrum. There is rightward deviation of the nasal septum. There is a concha bullosa on the left, a stable anatomic variant.  CT CERVICAL SPINE FINDINGS  There is no appreciable fracture or spondylolisthesis. Prevertebral soft tissues and predental space regions are normal. There is moderately severe disc space narrowing at C5-6 and C6-7. There is milder disc space narrowing at C2-C3 and C3-4. There is multilevel facet osteoarthritic change. No frank disc extrusion or stenosis. There is calcification in each carotid artery. There is a small benign appearing cyst in the rightward aspect of the C3 vertebral body, stable.  IMPRESSION: CT head: Atrophy with patchy supratentorial small vessel disease. No intracranial mass, hemorrhage, or extra-axial fluid collection. No acute infarct apparent. Mild paranasal sinus disease.  CT cervical spine:  Multilevel osteoarthritic change. No fracture or spondylolisthesis. There is calcification in both carotid arteries.   Electronically Signed   By: Bretta BangWilliam  Woodruff M.D.   On: 07/08/2014 21:18   Ct Cervical Spine Wo Contrast  07/08/2014   CLINICAL DATA:  Patient fell with pain after fall  EXAM: CT HEAD WITHOUT CONTRAST  CT CERVICAL SPINE WITHOUT CONTRAST  TECHNIQUE: Multidetector CT imaging of the head and cervical spine was performed following the standard protocol without intravenous contrast. Multiplanar CT image reconstructions of the cervical spine were also generated.  COMPARISON:  Head CT January 19, 2014; cervical spine CT December 02, 2013  FINDINGS: CT HEAD FINDINGS  There is moderate diffuse atrophy. There is no mass, hemorrhage, extra-axial fluid collection, or midline shift. There is small vessel disease, patchy, throughout the centra semiovale bilaterally, stable. There is no new gray-white compartment lesion. No acute infarct apparent. The bony calvarium appears intact. The mastoid air cells are clear. There are small retention cysts in the left maxillary antrum. There is rightward deviation of the nasal septum. There is a concha bullosa on the left, a stable anatomic variant.  CT CERVICAL SPINE FINDINGS  There is no appreciable fracture or spondylolisthesis. Prevertebral soft tissues and predental space regions are normal. There is moderately severe disc space narrowing at C5-6 and C6-7. There is milder disc space narrowing at C2-C3 and C3-4. There is multilevel facet osteoarthritic change. No frank disc extrusion or stenosis. There is calcification in each carotid artery. There is a small benign appearing cyst in the rightward aspect of the C3 vertebral body, stable.  IMPRESSION: CT head: Atrophy with patchy supratentorial small vessel disease. No intracranial mass, hemorrhage, or extra-axial fluid collection. No acute infarct apparent. Mild paranasal sinus disease.  CT cervical spine: Multilevel  osteoarthritic change. No fracture or spondylolisthesis. There is calcification in both carotid arteries.   Electronically Signed   By: Bretta BangWilliam  Woodruff M.D.   On: 07/08/2014 21:18     EKG Interpretation   Date/Time:  Thursday July 08 2014 22:18:34 EST Ventricular Rate:  74 PR Interval:  140 QRS Duration: 92 QT Interval:  432 QTC Calculation: 479 R Axis:   38 Text Interpretation:  Normal  sinus rhythm Possible Left atrial enlargement  Possible Inferior infarct , age undetermined Cannot rule out Anterior  infarct , age undetermined Artifact Confirmed by Manus Gunning  MD, Aliegha Paullin  [id number]) on 07/08/2014 10:24:13 PM      MDM   Final diagnoses:  Fall   From assisted living facility with witnessed fall. She tripped and hit her head but did not lose consciousness. Dementia at baseline. Not anticoagulated.  Patient is hypertensive as she has been during previous visits.  CT head and C-spine negative. Patient able to ambulate and is at her baseline mental status by report. No apparent significant trauma from fall and safe for return to ECF.   I personally performed the services described in this documentation, which was scribed in my presence. The recorded information has been reviewed and is accurate.   Glynn Octave, MD 07/09/14 [phone]

## 2014-07-08 NOTE — Discharge Instructions (Signed)

## 2014-08-10 ENCOUNTER — Emergency Department (HOSPITAL_BASED_OUTPATIENT_CLINIC_OR_DEPARTMENT_OTHER): Payer: Medicare Other

## 2014-08-10 ENCOUNTER — Encounter (HOSPITAL_BASED_OUTPATIENT_CLINIC_OR_DEPARTMENT_OTHER): Payer: Self-pay

## 2014-08-10 ENCOUNTER — Emergency Department (HOSPITAL_BASED_OUTPATIENT_CLINIC_OR_DEPARTMENT_OTHER)
Admission: EM | Admit: 2014-08-10 | Discharge: 2014-08-10 | Disposition: A | Discharge status: Home / Self Care | Payer: Medicare Other | Attending: Emergency Medicine | Admitting: Emergency Medicine

## 2014-08-10 DIAGNOSIS — G629 Polyneuropathy, unspecified: Secondary | ICD-10-CM | POA: Insufficient documentation

## 2014-08-10 DIAGNOSIS — S0003XD Contusion of scalp, subsequent encounter: Secondary | ICD-10-CM | POA: Insufficient documentation

## 2014-08-10 DIAGNOSIS — F0390 Unspecified dementia without behavioral disturbance: Secondary | ICD-10-CM | POA: Diagnosis not present

## 2014-08-10 DIAGNOSIS — Z872 Personal history of diseases of the skin and subcutaneous tissue: Secondary | ICD-10-CM | POA: Insufficient documentation

## 2014-08-10 DIAGNOSIS — I739 Peripheral vascular disease, unspecified: Secondary | ICD-10-CM | POA: Insufficient documentation

## 2014-08-10 DIAGNOSIS — K5900 Constipation, unspecified: Secondary | ICD-10-CM | POA: Insufficient documentation

## 2014-08-10 DIAGNOSIS — Z79899 Other long term (current) drug therapy: Secondary | ICD-10-CM | POA: Diagnosis not present

## 2014-08-10 DIAGNOSIS — G8929 Other chronic pain: Secondary | ICD-10-CM | POA: Insufficient documentation

## 2014-08-10 DIAGNOSIS — W19XXXA Unspecified fall, initial encounter: Secondary | ICD-10-CM

## 2014-08-10 DIAGNOSIS — Z8711 Personal history of peptic ulcer disease: Secondary | ICD-10-CM | POA: Diagnosis not present

## 2014-08-10 DIAGNOSIS — Z7982 Long term (current) use of aspirin: Secondary | ICD-10-CM | POA: Diagnosis not present

## 2014-08-10 DIAGNOSIS — F329 Major depressive disorder, single episode, unspecified: Secondary | ICD-10-CM | POA: Diagnosis not present

## 2014-08-10 DIAGNOSIS — Z4800 Encounter for change or removal of nonsurgical wound dressing: Secondary | ICD-10-CM | POA: Diagnosis present

## 2014-08-10 DIAGNOSIS — I10 Essential (primary) hypertension: Secondary | ICD-10-CM | POA: Insufficient documentation

## 2014-08-10 DIAGNOSIS — W1839XD Other fall on same level, subsequent encounter: Secondary | ICD-10-CM | POA: Insufficient documentation

## 2014-08-10 DIAGNOSIS — S0003XA Contusion of scalp, initial encounter: Secondary | ICD-10-CM

## 2014-08-10 LAB — URINALYSIS, ROUTINE W REFLEX MICROSCOPIC
Bilirubin Urine: NEGATIVE
Glucose, UA: NEGATIVE mg/dL
HGB URINE DIPSTICK: NEGATIVE
Ketones, ur: NEGATIVE mg/dL
Leukocytes, UA: NEGATIVE
Nitrite: NEGATIVE
PH: 7 (ref 5.0–8.0)
Protein, ur: NEGATIVE mg/dL
SPECIFIC GRAVITY, URINE: 1.01 (ref 1.005–1.030)
UROBILINOGEN UA: 1 mg/dL (ref 0.0–1.0)

## 2014-08-10 NOTE — Discharge Instructions (Signed)

## 2014-08-10 NOTE — ED Notes (Addendum)
Pt was seen in ED after a fall on 07/09/15 and discharged.  Per EMS pt was evaluated by PA at facility and sent to ED for evaluation of hematoma on forehead.  No change in baseline mentation per staff, however has not been as active the past 2 days.

## 2014-08-10 NOTE — ED Notes (Signed)
[facility], informed of BP and need for dose of Propanolol.  Staff states will administer medication on arrival.  Pt alert and at baseline.

## 2014-08-10 NOTE — ED Notes (Signed)
Spoke with [facility] regarding [facility] status.  Report to PA-C.

## 2014-08-10 NOTE — ED Provider Notes (Signed)
CSN: [id number]     Arrival date & time 08/10/14  1227 History   First MD Initiated Contact with Patient 08/10/14 1335     Chief Complaint  Patient presents with  . Wound Check     (Consider location/radiation/quality/duration/timing/severity/associated sxs/prior Treatment) HPI   Sarah Alvarado is a(n) 80 y.o. female who presents from the SNF for evaluation of scalp hematoma. Level 5 caveat due to dementia. The nursing home staff reports that the patient is normally ambulatory, however, has not been for the past 2 days and has also had less of an appetite. She is otherwise at her baseline mentation. The PA saw her today and wanted her evaluated here in the emergency department. The patient did have a fall back on 07/08/2014. No mention of hematoma at that visit. However, she does have a large hematoma on the right frontal scalp.    Past Medical History  Diagnosis Date  . Hypertension   . Depression   . Dementia   . Back pain   . Neuropathy     peripheral  . Constipation   . Eczema   . Peptic ulcer   . Peripheral neuropathy   . Aortic aneurysm   . Allergic rhinitis   . IC (irritable colon)   . Hearing loss   . Tremor   . Eczema   . HA (headache)   . Back pain   . Aortic valve disease   . Diverticulitis   . Chronic left hip pain   . Chronic low back pain   . PVD (peripheral vascular disease)    Past Surgical History  Procedure Laterality Date  .  unable to obtain due to dementia history      No family history on file. History  Substance Use Topics  . Smoking status: Unknown If Ever Smoked  . Smokeless tobacco: Not on file  . Alcohol Use: No   OB History    No data available     Review of Systems  Unable to perform ROS: Dementia    Ten systems reviewed and are negative for acute change, except as noted in the HPI.    Allergies  Review of patient's allergies indicates no known allergies.  Home Medications   Prior to Admission medications   Medication  Sig Start Date End Date Taking? Authorizing Provider  alum & mag hydroxide-simeth (MAALOX/MYLANTA) 200-200-20 MG/5ML suspension Take 15 mLs by mouth every 6 (six) hours as needed. For indigestion    Historical Provider, MD  aspirin 81 MG chewable tablet Chew 81 mg by mouth daily.    Historical Provider, MD  Atorvastatin Calcium (LIPITOR PO) Take 5 mg by mouth at bedtime.    Historical Provider, MD  Cholecalciferol (VITAMIN D3) 1000 UNITS CAPS Take 1 capsule by mouth daily.    Historical Provider, MD  Cranberry 475 MG CAPS Take 1 capsule by mouth 2 (two) times daily.    Historical Provider, MD  docusate sodium (COLACE) 250 MG capsule Take 250 mg by mouth daily.    Historical Provider, MD  donepezil (ARICEPT) 10 MG tablet Take 1 tablet (10 mg total) by mouth at bedtime. 02/12/12   Charm RingsErin J Honig, MD  DULoxetine (CYMBALTA) 30 MG capsule Take 30 mg by mouth daily. Take along with 60mg  capsule by mouth each day for a total of 90mg  by mouth daily.    Historical Provider, MD  DULoxetine (CYMBALTA) 60 MG capsule Take 60 mg by mouth daily. Take along with a 30 mg capsule for  a total of 90mg  by mouth daily 02/12/12   Charm Rings, MD  fluocinolone (VANOS) 0.01 % cream Apply 1 application topically 2 (two) times daily as needed. To affected areas    Historical Provider, MD  gabapentin (NEURONTIN) 300 MG capsule Take 300 mg by mouth 2 (two) times daily. 02/12/12   Charm Rings, MD  HYDROcodone-acetaminophen (NORCO/VICODIN) 5-325 MG per tablet Take 1 tablet by mouth every 4 (four) hours as needed for pain.    Historical Provider, MD  magnesium hydroxide (MILK OF MAGNESIA) 400 MG/5ML suspension Take 15 mLs by mouth daily as needed. For constipation    Historical Provider, MD  omeprazole (PRILOSEC) 20 MG capsule Take 20 mg by mouth daily.    Historical Provider, MD  oseltamivir (TAMIFLU) 75 MG capsule Take 1 capsule (75 mg total) by mouth every 12 (twelve) hours. 09/07/13   Rolan Bucco, MD  polyethylene glycol (MIRALAX /  GLYCOLAX) packet Take 17 g by mouth daily. 09/07/13   Rolan Bucco, MD  propranolol (INDERAL) 10 MG tablet Take 1 tablet (10 mg total) by mouth 3 (three) times daily. 02/12/12 02/15/13  Charm Rings, MD  QUEtiapine (SEROQUEL) 25 MG tablet Take 1 tablet (25 mg total) by mouth 2 (two) times daily. Patient taking differently: Take 50 mg by mouth at bedtime.  02/12/12   Charm Rings, MD  SUMAtriptan (IMITREX) 50 MG tablet Take 50 mg by mouth every 2 (two) hours as needed. For headache    Historical Provider, MD  topiramate (TOPAMAX) 50 MG tablet Take 50 mg by mouth 3 (three) times daily. 02/12/12   Charm Rings, MD  traMADol (ULTRAM) 50 MG tablet Take 1 tablet (50 mg total) by mouth 4 (four) times daily as needed. For pain 02/12/12   Charm Rings, MD  triamcinolone lotion (KENALOG) 0.1 % Apply 1 application topically daily. Apply to red itchy area every day directed    Historical Provider, MD   BP 157/88 mmHg  Pulse 68  Temp(Src) 98.1 F (36.7 C) (Oral)  Resp 16  SpO2 100% Physical Exam  Constitutional: She is oriented to person, place, and time. She appears well-developed and well-nourished. No distress.  HENT:  Head: Normocephalic. Head is with contusion.    There is a hematoma on the right frontal scalp which is approximately 3 cm in circumference with surrounding ecchymosis that travels back into the scalp and down the right side of the face. The bruising appears old and is in various stages of resolution including yellowish, brown and purple discolorations.  Eyes: Conjunctivae are normal. No scleral icterus.  Neck: Normal range of motion.  Cardiovascular: Normal rate, regular rhythm and normal heart sounds.  Exam reveals no gallop and no friction rub.   No murmur heard. Pulmonary/Chest: Effort normal and breath sounds normal. No respiratory distress.  Abdominal: Soft. Bowel sounds are normal. She exhibits no distension and no mass. There is no tenderness. There is no guarding.  Neurological: She  is alert and oriented to person, place, and time.  Skin: Skin is warm and dry. She is not diaphoretic.  Nursing note and vitals reviewed.   ED Course  Procedures (including critical care time) Labs Review Labs Reviewed  URINALYSIS, ROUTINE W REFLEX MICROSCOPIC - Abnormal; Notable for the following:    APPearance CLOUDY (*)    All other components within normal limits  BASIC METABOLIC PANEL    Imaging Review Ct Head Wo Contrast  08/10/2014   CLINICAL DATA:  Patient  fell 07/08/2014.  Right frontal hematoma.  EXAM: CT HEAD WITHOUT CONTRAST  CT CERVICAL SPINE WITHOUT CONTRAST  TECHNIQUE: Multidetector CT imaging of the head and cervical spine was performed following the standard protocol without intravenous contrast. Multiplanar CT image reconstructions of the cervical spine were also generated.  COMPARISON:  08/03/2014  FINDINGS: CT HEAD FINDINGS  There is no evidence of mass effect, midline shift, or extra-axial fluid collections. There is no evidence of a space-occupying lesion or intracranial hemorrhage. There is no evidence of a cortical-based area of acute infarction. There is generalized cerebral atrophy. There is periventricular white matter low attenuation likely secondary to microangiopathy.  The ventricles and sulci are appropriate for the patient's age. The basal cisterns are patent.  Visualized portions of the orbits are unremarkable. The mastoid sinuses are clear. There is mild mucosal thickening in the left maxillary sinus. Cerebrovascular atherosclerotic calcifications are noted.  The osseous structures are unremarkable. There is a small right frontal scalp hematoma.  CT CERVICAL SPINE FINDINGS  The alignment is anatomic. The vertebral body heights are maintained. There is no acute fracture. There is no static listhesis. The prevertebral soft tissues are normal. The intraspinal soft tissues are not fully imaged on this examination due to poor soft tissue contrast, but there is no gross  soft tissue abnormality.  There is degenerative disc disease at C5-6 and C6-7. There is a broad-based disc osteophyte complex at C6-7 C7-T1. There is bilateral facet arthropathy C4-5. There is bilateral uncovertebral degenerative change at C5-6 with bilateral mild foraminal encroachment.  The visualized portions of the left lung apex demonstrate no focal abnormality.There is bilateral carotid artery atherosclerosis.  IMPRESSION: 1. No acute intracranial pathology. 2. No acute osseous injury of the cervical spine. 3. Cervical spine spondylosis as described above. 4. Small right frontal scalp hematoma.   Electronically Signed   By: Elige KoHetal  Patel   On: 08/10/2014 14:37   Ct Cervical Spine Wo Contrast  08/10/2014   CLINICAL DATA:  Patient fell 07/08/2014.  Right frontal hematoma.  EXAM: CT HEAD WITHOUT CONTRAST  CT CERVICAL SPINE WITHOUT CONTRAST  TECHNIQUE: Multidetector CT imaging of the head and cervical spine was performed following the standard protocol without intravenous contrast. Multiplanar CT image reconstructions of the cervical spine were also generated.  COMPARISON:  08/03/2014  FINDINGS: CT HEAD FINDINGS  There is no evidence of mass effect, midline shift, or extra-axial fluid collections. There is no evidence of a space-occupying lesion or intracranial hemorrhage. There is no evidence of a cortical-based area of acute infarction. There is generalized cerebral atrophy. There is periventricular white matter low attenuation likely secondary to microangiopathy.  The ventricles and sulci are appropriate for the patient's age. The basal cisterns are patent.  Visualized portions of the orbits are unremarkable. The mastoid sinuses are clear. There is mild mucosal thickening in the left maxillary sinus. Cerebrovascular atherosclerotic calcifications are noted.  The osseous structures are unremarkable. There is a small right frontal scalp hematoma.  CT CERVICAL SPINE FINDINGS  The alignment is anatomic. The  vertebral body heights are maintained. There is no acute fracture. There is no static listhesis. The prevertebral soft tissues are normal. The intraspinal soft tissues are not fully imaged on this examination due to poor soft tissue contrast, but there is no gross soft tissue abnormality.  There is degenerative disc disease at C5-6 and C6-7. There is a broad-based disc osteophyte complex at C6-7 C7-T1. There is bilateral facet arthropathy C4-5. There is bilateral uncovertebral degenerative change  at C5-6 with bilateral mild foraminal encroachment.  The visualized portions of the left lung apex demonstrate no focal abnormality.There is bilateral carotid artery atherosclerosis.  IMPRESSION: 1. No acute intracranial pathology. 2. No acute osseous injury of the cervical spine. 3. Cervical spine spondylosis as described above. 4. Small right frontal scalp hematoma.   Electronically Signed   By: Elige Ko   On: 08/10/2014 14:37     EKG Interpretation None      MDM   Final diagnoses:  Fall  Hematoma of scalp, initial encounter    Patient with negative head CT, CT C-spine. Urine appears normal. She is afebrile and hemodynamically stable. The patient has DO NOT RESUSCITATE status on file. I do not feel that further evaluation is warranted at this time. The patient is alert and interactive with staff. She appears safe for discharge at this time.    Arthor Captain, PA-C 08/10/14 1517  Toy Cookey, MD 08/10/14 [phone]

## 2014-08-10 NOTE — ED Notes (Signed)
Attempted blood draw x2 unsuccessful 

## 2014-08-10 NOTE — ED Notes (Signed)
PO fluids provided. 

## 2014-09-10 ENCOUNTER — Emergency Department (HOSPITAL_BASED_OUTPATIENT_CLINIC_OR_DEPARTMENT_OTHER): Payer: Medicare Other

## 2014-09-10 ENCOUNTER — Encounter (HOSPITAL_BASED_OUTPATIENT_CLINIC_OR_DEPARTMENT_OTHER): Payer: Self-pay

## 2014-09-10 ENCOUNTER — Emergency Department (HOSPITAL_BASED_OUTPATIENT_CLINIC_OR_DEPARTMENT_OTHER)
Admission: EM | Admit: 2014-09-10 | Discharge: 2014-09-10 | Disposition: A | Discharge status: Home / Self Care | Payer: Medicare Other | Attending: Emergency Medicine | Admitting: Emergency Medicine

## 2014-09-10 DIAGNOSIS — S41121A Laceration with foreign body of right upper arm, initial encounter: Secondary | ICD-10-CM | POA: Insufficient documentation

## 2014-09-10 DIAGNOSIS — Y998 Other external cause status: Secondary | ICD-10-CM | POA: Diagnosis not present

## 2014-09-10 DIAGNOSIS — F329 Major depressive disorder, single episode, unspecified: Secondary | ICD-10-CM | POA: Insufficient documentation

## 2014-09-10 DIAGNOSIS — Z79899 Other long term (current) drug therapy: Secondary | ICD-10-CM | POA: Insufficient documentation

## 2014-09-10 DIAGNOSIS — F0390 Unspecified dementia without behavioral disturbance: Secondary | ICD-10-CM | POA: Diagnosis not present

## 2014-09-10 DIAGNOSIS — W19XXXA Unspecified fall, initial encounter: Secondary | ICD-10-CM

## 2014-09-10 DIAGNOSIS — G8929 Other chronic pain: Secondary | ICD-10-CM | POA: Insufficient documentation

## 2014-09-10 DIAGNOSIS — I10 Essential (primary) hypertension: Secondary | ICD-10-CM | POA: Insufficient documentation

## 2014-09-10 DIAGNOSIS — Y92128 Other place in nursing home as the place of occurrence of the external cause: Secondary | ICD-10-CM | POA: Diagnosis not present

## 2014-09-10 DIAGNOSIS — K5900 Constipation, unspecified: Secondary | ICD-10-CM | POA: Diagnosis not present

## 2014-09-10 DIAGNOSIS — Y9389 Activity, other specified: Secondary | ICD-10-CM | POA: Insufficient documentation

## 2014-09-10 DIAGNOSIS — Z7982 Long term (current) use of aspirin: Secondary | ICD-10-CM | POA: Insufficient documentation

## 2014-09-10 DIAGNOSIS — H9190 Unspecified hearing loss, unspecified ear: Secondary | ICD-10-CM | POA: Diagnosis not present

## 2014-09-10 DIAGNOSIS — G629 Polyneuropathy, unspecified: Secondary | ICD-10-CM | POA: Diagnosis not present

## 2014-09-10 DIAGNOSIS — Z872 Personal history of diseases of the skin and subcutaneous tissue: Secondary | ICD-10-CM | POA: Insufficient documentation

## 2014-09-10 DIAGNOSIS — Z8711 Personal history of peptic ulcer disease: Secondary | ICD-10-CM | POA: Diagnosis not present

## 2014-09-10 DIAGNOSIS — W1839XA Other fall on same level, initial encounter: Secondary | ICD-10-CM | POA: Diagnosis not present

## 2014-09-10 DIAGNOSIS — R011 Cardiac murmur, unspecified: Secondary | ICD-10-CM | POA: Diagnosis not present

## 2014-09-10 DIAGNOSIS — Z043 Encounter for examination and observation following other accident: Secondary | ICD-10-CM | POA: Diagnosis present

## 2014-09-10 LAB — CBC WITH DIFFERENTIAL/PLATELET
BASOS ABS: 0 10*3/uL (ref 0.0–0.1)
Basophils Relative: 0 % (ref 0–1)
EOS ABS: 0.3 10*3/uL (ref 0.0–0.7)
Eosinophils Relative: 3 % (ref 0–5)
HEMATOCRIT: 37.1 % (ref 36.0–46.0)
Hemoglobin: 12 g/dL (ref 12.0–15.0)
LYMPHS PCT: 15 % (ref 12–46)
Lymphs Abs: 1.3 10*3/uL (ref 0.7–4.0)
MCH: 30.8 pg (ref 26.0–34.0)
MCHC: 32.3 g/dL (ref 30.0–36.0)
MCV: 95.1 fL (ref 78.0–100.0)
MONO ABS: 0.8 10*3/uL (ref 0.1–1.0)
MONOS PCT: 9 % (ref 3–12)
Neutro Abs: 6.4 10*3/uL (ref 1.7–7.7)
Neutrophils Relative %: 73 % (ref 43–77)
Platelets: 218 10*3/uL (ref 150–400)
RBC: 3.9 MIL/uL (ref 3.87–5.11)
RDW: 13.7 % (ref 11.5–15.5)
WBC: 8.8 10*3/uL (ref 4.0–10.5)

## 2014-09-10 LAB — BASIC METABOLIC PANEL
Anion gap: 5 (ref 5–15)
BUN: 27 mg/dL — AB (ref 6–23)
CALCIUM: 8.8 mg/dL (ref 8.4–10.5)
CHLORIDE: 112 mmol/L (ref 96–112)
CO2: 21 mmol/L (ref 19–32)
Creatinine, Ser: 1.12 mg/dL — ABNORMAL HIGH (ref 0.50–1.10)
GFR, EST AFRICAN AMERICAN: 52 mL/min — AB (ref >90)
GFR, EST NON AFRICAN AMERICAN: 45 mL/min — AB (ref >90)
Glucose, Bld: 113 mg/dL — ABNORMAL HIGH (ref 70–99)
POTASSIUM: 4 mmol/L (ref 3.5–5.1)
Sodium: 138 mmol/L (ref 135–145)

## 2014-09-10 LAB — TROPONIN I: Troponin I: <0.03 ng/mL (ref <0.031)

## 2014-09-10 NOTE — ED Notes (Signed)
[facility] here at this time to transport pt back to SNF 

## 2014-09-10 NOTE — ED Notes (Signed)
Patient transported to CT and XR 

## 2014-09-10 NOTE — ED Notes (Signed)
Ambulated patient. Pt ambulated with 2 staff assist. Pt was able to ambulate. Pt now moved to chair, watching tv.

## 2014-09-10 NOTE — ED Provider Notes (Signed)
CSN: [id number]     Arrival date & time 09/10/14  1607 History   First MD Initiated Contact with Patient 09/10/14 1611     Chief Complaint  Patient presents with  . Fall     (Consider location/radiation/quality/duration/timing/severity/associated sxs/prior Treatment) HPI Comments: Patient from nursing home after apparent fall. She was found on the ground under unclear circumstances. Patient has dementia unable to give a history. Per EMS she was found rolled up like she was trying to sleep. No obvious trauma. She is on aspirin but no other anticoagulation. She is at her baseline per her nursing facility report.  The history is provided by the patient and the EMS personnel. The history is limited by the condition of the patient.    Past Medical History  Diagnosis Date  . Hypertension   . Depression   . Dementia   . Back pain   . Neuropathy     peripheral  . Constipation   . Eczema   . Peptic ulcer   . Peripheral neuropathy   . Aortic aneurysm   . Allergic rhinitis   . IC (irritable colon)   . Hearing loss   . Tremor   . Eczema   . HA (headache)   . Back pain   . Aortic valve disease   . Diverticulitis   . Chronic left hip pain   . Chronic low back pain   . PVD (peripheral vascular disease)    Past Surgical History  Procedure Laterality Date  .  unable to obtain due to dementia history      No family history on file. History  Substance Use Topics  . Smoking status: Unknown If Ever Smoked  . Smokeless tobacco: Not on file  . Alcohol Use: No   OB History    No data available     Review of Systems  Unable to perform ROS: Dementia      Allergies  Review of patient's allergies indicates no known allergies.  Home Medications   Prior to Admission medications   Medication Sig Start Date End Date Taking? Authorizing Provider  alum & mag hydroxide-simeth (MAALOX/MYLANTA) 200-200-20 MG/5ML suspension Take 15 mLs by mouth every 6 (six) hours as needed. For  indigestion    Historical Provider, MD  aspirin 81 MG chewable tablet Chew 81 mg by mouth daily.    Historical Provider, MD  Atorvastatin Calcium (LIPITOR PO) Take 5 mg by mouth at bedtime.    Historical Provider, MD  Cholecalciferol (VITAMIN D3) 1000 UNITS CAPS Take 1 capsule by mouth daily.    Historical Provider, MD  Cranberry 475 MG CAPS Take 1 capsule by mouth 2 (two) times daily.    Historical Provider, MD  docusate sodium (COLACE) 250 MG capsule Take 250 mg by mouth daily.    Historical Provider, MD  donepezil (ARICEPT) 10 MG tablet Take 1 tablet (10 mg total) by mouth at bedtime. 02/12/12   Charm Rings, MD  DULoxetine (CYMBALTA) 30 MG capsule Take 30 mg by mouth daily. Take along with 60mg  capsule by mouth each day for a total of 90mg  by mouth daily.    Historical Provider, MD  DULoxetine (CYMBALTA) 60 MG capsule Take 60 mg by mouth daily. Take along with a 30 mg capsule for a total of 90mg  by mouth daily 02/12/12   Charm Rings, MD  fluocinolone (VANOS) 0.01 % cream Apply 1 application topically 2 (two) times daily as needed. To affected areas    Historical Provider,  MD  gabapentin (NEURONTIN) 300 MG capsule Take 300 mg by mouth 2 (two) times daily. 02/12/12   Charm RingsErin J Honig, MD  HYDROcodone-acetaminophen (NORCO/VICODIN) 5-325 MG per tablet Take 1 tablet by mouth every 4 (four) hours as needed for pain.    Historical Provider, MD  magnesium hydroxide (MILK OF MAGNESIA) 400 MG/5ML suspension Take 15 mLs by mouth daily as needed. For constipation    Historical Provider, MD  omeprazole (PRILOSEC) 20 MG capsule Take 20 mg by mouth daily.    Historical Provider, MD  oseltamivir (TAMIFLU) 75 MG capsule Take 1 capsule (75 mg total) by mouth every 12 (twelve) hours. 09/07/13   Rolan BuccoMelanie Belfi, MD  polyethylene glycol (MIRALAX / GLYCOLAX) packet Take 17 g by mouth daily. 09/07/13   Rolan BuccoMelanie Belfi, MD  propranolol (INDERAL) 10 MG tablet Take 1 tablet (10 mg total) by mouth 3 (three) times daily. 02/12/12 02/15/13   Charm RingsErin J Honig, MD  QUEtiapine (SEROQUEL) 25 MG tablet Take 1 tablet (25 mg total) by mouth 2 (two) times daily. Patient taking differently: Take 50 mg by mouth at bedtime.  02/12/12   Charm RingsErin J Honig, MD  SUMAtriptan (IMITREX) 50 MG tablet Take 50 mg by mouth every 2 (two) hours as needed. For headache    Historical Provider, MD  topiramate (TOPAMAX) 50 MG tablet Take 50 mg by mouth 3 (three) times daily. 02/12/12   Charm RingsErin J Honig, MD  traMADol (ULTRAM) 50 MG tablet Take 1 tablet (50 mg total) by mouth 4 (four) times daily as needed. For pain 02/12/12   Charm RingsErin J Honig, MD  triamcinolone lotion (KENALOG) 0.1 % Apply 1 application topically daily. Apply to red itchy area every day directed    Historical Provider, MD   BP 132/103 mmHg  Pulse 94  Resp 18  Wt 90 lb (40.824 kg)  SpO2 97% Physical Exam  Constitutional: She is oriented to person, place, and time. She appears well-developed and well-nourished. No distress.  Demented, nonverbal, follows some commands  HENT:  Head: Normocephalic and atraumatic.  Mouth/Throat: Oropharynx is clear and moist. No oropharyngeal exudate.  Eyes: Conjunctivae and EOM are normal. Pupils are equal, round, and reactive to light.  Neck: Normal range of motion. Neck supple.  No C spine tenderness or stepoffs  Cardiovascular: Normal rate, regular rhythm and intact distal pulses.   Murmur heard. Pulmonary/Chest: Effort normal and breath sounds normal. No respiratory distress.  Abdominal: Soft. There is no tenderness. There is no rebound and no guarding.  Musculoskeletal: Normal range of motion. She exhibits no edema or tenderness.  No T or L spine tenderness.  FROM hips without pain  3 large skin tears to R upper arm, bandaged. Small amount of oozing, no signs of infection  Neurological: She is alert and oriented to person, place, and time. No cranial nerve deficit. She exhibits normal muscle tone. Coordination normal.  Alert, moving all extremities, nonverbal.  Skin: Skin  is warm.  Psychiatric: She has a normal mood and affect. Her behavior is normal.  Nursing note and vitals reviewed.   ED Course  Procedures (including critical care time) Labs Review Labs Reviewed  BASIC METABOLIC PANEL - Abnormal; Notable for the following:    Glucose, Bld 113 (*)    BUN 27 (*)    Creatinine, Ser 1.12 (*)    GFR calc non Af Amer 45 (*)    GFR calc Af Amer 52 (*)    All other components within normal limits  CBC WITH DIFFERENTIAL/PLATELET  TROPONIN I    Imaging Review Dg Chest 2 View  09/10/2014   CLINICAL DATA:  Found lying on floor by nursing home staff.  EXAM: CHEST  2 VIEW  COMPARISON:  09/05/2014  FINDINGS: The heart size and mediastinal contours are within normal limits. Both lungs are clear. The visualized skeletal structures are unremarkable.  There is no significant interval change.  IMPRESSION: No active cardiopulmonary disease.   Electronically Signed   By: Ellery Plunk M.D.   On: 09/10/2014 17:27   Dg Pelvis 1-2 Views  09/10/2014   CLINICAL DATA:  Found lying on floor by nursing home staff.  EXAM: PELVIS - 1-2 VIEW  COMPARISON:  CT 09/07/2013  FINDINGS: There is no evidence of pelvic fracture or diastasis. No pelvic bone lesions are seen.  IMPRESSION: Negative.   Electronically Signed   By: Ellery Plunk M.D.   On: 09/10/2014 17:32   Ct Head Wo Contrast  09/10/2014   CLINICAL DATA:  Fall at nursing home and found on floor. Initial encounter.  EXAM: CT HEAD WITHOUT CONTRAST  CT CERVICAL SPINE WITHOUT CONTRAST  TECHNIQUE: Multidetector CT imaging of the head and cervical spine was performed following the standard protocol without intravenous contrast. Multiplanar CT image reconstructions of the cervical spine were also generated.  COMPARISON:  08/10/2014  FINDINGS: CT HEAD FINDINGS  Stable moderately advanced small vessel ischemic disease in the periventricular white matter. Stable cortical atrophy. The brain demonstrates no evidence of hemorrhage,  infarction, edema, mass effect, extra-axial fluid collection, hydrocephalus or mass lesion. The skull is unremarkable.  CT CERVICAL SPINE FINDINGS  The cervical spine shows normal alignment. There is no evidence of acute fracture or subluxation. No soft tissue swelling or hematoma is identified. Stable moderate spondylosis at C5-6 and C6-7. No bony or soft tissue lesions are seen. The visualized airway is normally patent.  IMPRESSION: 1. Stable small vessel disease and atrophy of the brain. No acute findings by head CT. 2. Stable cervical spondylosis.  No evidence of cervical fracture.   Electronically Signed   By: Irish Lack M.D.   On: 09/10/2014 17:16   Ct Cervical Spine Wo Contrast  09/10/2014   CLINICAL DATA:  Fall at nursing home and found on floor. Initial encounter.  EXAM: CT HEAD WITHOUT CONTRAST  CT CERVICAL SPINE WITHOUT CONTRAST  TECHNIQUE: Multidetector CT imaging of the head and cervical spine was performed following the standard protocol without intravenous contrast. Multiplanar CT image reconstructions of the cervical spine were also generated.  COMPARISON:  08/10/2014  FINDINGS: CT HEAD FINDINGS  Stable moderately advanced small vessel ischemic disease in the periventricular white matter. Stable cortical atrophy. The brain demonstrates no evidence of hemorrhage, infarction, edema, mass effect, extra-axial fluid collection, hydrocephalus or mass lesion. The skull is unremarkable.  CT CERVICAL SPINE FINDINGS  The cervical spine shows normal alignment. There is no evidence of acute fracture or subluxation. No soft tissue swelling or hematoma is identified. Stable moderate spondylosis at C5-6 and C6-7. No bony or soft tissue lesions are seen. The visualized airway is normally patent.  IMPRESSION: 1. Stable small vessel disease and atrophy of the brain. No acute findings by head CT. 2. Stable cervical spondylosis.  No evidence of cervical fracture.   Electronically Signed   By: Irish Lack  M.D.   On: 09/10/2014 17:16   Dg Humerus Right  09/10/2014   CLINICAL DATA:  From nurses notes --Pt found lying in the floor by nursing home staff. Per EMS pt  was seen ambulating in the hall 30 minutes prior, Pt was non verbal, pt would not raise arms for lateral cxr, best images obtained  EXAM: RIGHT HUMERUS - 2+ VIEW  COMPARISON:  None.  FINDINGS: No acute fracture. Shoulder and elbow joints are normally aligned. No bone lesion.  There are old healed rib fractures on the right.  Bones are diffusely demineralized.  Soft tissues are unremarkable.  IMPRESSION: No fracture or dislocation or acute finding.   Electronically Signed   By: Amie Portland M.D.   On: 09/10/2014 17:29     EKG Interpretation   Date/Time:  Friday September 10 2014 17:24:53 EST Ventricular Rate:  77 PR Interval:  132 QRS Duration: 102 QT Interval:  418 QTC Calculation: 473 R Axis:   58 Text Interpretation:  Normal sinus rhythm Right atrial enlargement  Possible Inferior infarct , age undetermined Abnormal ECG No significant  change was found Confirmed by Manus Gunning  MD, Threasa Kinch [phone]) on 09/10/2014  5:26:53 PM      MDM   Final diagnoses:  Fall  Essential hypertension   Found on ground at nursing home after apparent fall. Normally ambulates at baseline. No obvious injury. Patient is demented and cannot give history.   Per records, patient sustained a fall on January 24 seen at [facility] [address] where the skin tears were documented.  Imaging negative for traumatic pathology. CT head and C-spine negative. Blood pressure elevated as during previous visits.  Patient appears to be at her baseline. Patient able to ambulate and is at her baseline mental status by report. No apparent significant trauma from fall and safe for return to ECF.   Glynn Octave, MD 09/11/14 [phone]

## 2014-09-10 NOTE — ED Notes (Signed)
Pt found lying in the floor by nursing home staff.  Per EMS pt was seen ambulating in the hall 30 minutes prior.

## 2014-09-10 NOTE — ED Notes (Signed)
Skin tear x 2 on right upper arm.  Dressing removed and telfa applied, wrapped with kerlix.

## 2014-09-10 NOTE — Discharge Instructions (Signed)

## 2014-09-10 NOTE — ED Notes (Signed)
MD at bedside. 

## 2014-09-21 ENCOUNTER — Emergency Department (HOSPITAL_BASED_OUTPATIENT_CLINIC_OR_DEPARTMENT_OTHER): Payer: Medicare Other

## 2014-09-21 ENCOUNTER — Encounter (HOSPITAL_BASED_OUTPATIENT_CLINIC_OR_DEPARTMENT_OTHER): Payer: Self-pay | Admitting: Emergency Medicine

## 2014-09-21 ENCOUNTER — Emergency Department (HOSPITAL_BASED_OUTPATIENT_CLINIC_OR_DEPARTMENT_OTHER)
Admission: EM | Admit: 2014-09-21 | Discharge: 2014-09-21 | Disposition: A | Discharge status: Home / Self Care | Payer: Medicare Other | Attending: Emergency Medicine | Admitting: Emergency Medicine

## 2014-09-21 DIAGNOSIS — W19XXXA Unspecified fall, initial encounter: Secondary | ICD-10-CM

## 2014-09-21 DIAGNOSIS — S0003XA Contusion of scalp, initial encounter: Secondary | ICD-10-CM | POA: Diagnosis not present

## 2014-09-21 DIAGNOSIS — Z8711 Personal history of peptic ulcer disease: Secondary | ICD-10-CM | POA: Diagnosis not present

## 2014-09-21 DIAGNOSIS — F329 Major depressive disorder, single episode, unspecified: Secondary | ICD-10-CM | POA: Insufficient documentation

## 2014-09-21 DIAGNOSIS — Y998 Other external cause status: Secondary | ICD-10-CM | POA: Diagnosis not present

## 2014-09-21 DIAGNOSIS — G629 Polyneuropathy, unspecified: Secondary | ICD-10-CM | POA: Diagnosis not present

## 2014-09-21 DIAGNOSIS — Z872 Personal history of diseases of the skin and subcutaneous tissue: Secondary | ICD-10-CM | POA: Insufficient documentation

## 2014-09-21 DIAGNOSIS — S0990XA Unspecified injury of head, initial encounter: Secondary | ICD-10-CM | POA: Diagnosis present

## 2014-09-21 DIAGNOSIS — Y9389 Activity, other specified: Secondary | ICD-10-CM | POA: Insufficient documentation

## 2014-09-21 DIAGNOSIS — I10 Essential (primary) hypertension: Secondary | ICD-10-CM | POA: Diagnosis not present

## 2014-09-21 DIAGNOSIS — G8929 Other chronic pain: Secondary | ICD-10-CM | POA: Diagnosis not present

## 2014-09-21 DIAGNOSIS — W1839XA Other fall on same level, initial encounter: Secondary | ICD-10-CM | POA: Insufficient documentation

## 2014-09-21 DIAGNOSIS — I739 Peripheral vascular disease, unspecified: Secondary | ICD-10-CM | POA: Insufficient documentation

## 2014-09-21 DIAGNOSIS — Z7982 Long term (current) use of aspirin: Secondary | ICD-10-CM | POA: Insufficient documentation

## 2014-09-21 DIAGNOSIS — K5900 Constipation, unspecified: Secondary | ICD-10-CM | POA: Insufficient documentation

## 2014-09-21 DIAGNOSIS — Z79899 Other long term (current) drug therapy: Secondary | ICD-10-CM | POA: Insufficient documentation

## 2014-09-21 DIAGNOSIS — Y92128 Other place in nursing home as the place of occurrence of the external cause: Secondary | ICD-10-CM | POA: Insufficient documentation

## 2014-09-21 DIAGNOSIS — F0390 Unspecified dementia without behavioral disturbance: Secondary | ICD-10-CM | POA: Diagnosis not present

## 2014-09-21 NOTE — ED Notes (Signed)
[facility] contacted for transportation back to facility.

## 2014-09-21 NOTE — ED Provider Notes (Signed)
CSN: [id number]     Arrival date & time 09/21/14  4098 History   First MD Initiated Contact with Patient 09/21/14 [phone]     Chief Complaint  Patient presents with  . Fall     HPI Patient presents via EMS from [facility] where she was found in the floor with an unwitnessed fall.  She has a hematoma noted just posterior area over scalp.  Patient is alert but disoriented.  This is her baseline.  No other complaints noted. Past Medical History  Diagnosis Date  . Hypertension   . Depression   . Dementia   . Back pain   . Neuropathy     peripheral  . Constipation   . Eczema   . Peptic ulcer   . Peripheral neuropathy   . Aortic aneurysm   . Allergic rhinitis   . IC (irritable colon)   . Hearing loss   . Tremor   . Eczema   . HA (headache)   . Back pain   . Aortic valve disease   . Diverticulitis   . Chronic left hip pain   . Chronic low back pain   . PVD (peripheral vascular disease)    Past Surgical History  Procedure Laterality Date  .  unable to obtain due to dementia history      History reviewed. No pertinent family history. History  Substance Use Topics  . Smoking status: Unknown If Ever Smoked  . Smokeless tobacco: Not on file  . Alcohol Use: No   OB History    No data available     Review of Systems  Unable to perform ROS: Dementia      Allergies  Review of patient's allergies indicates no known allergies.  Home Medications   Prior to Admission medications   Medication Sig Start Date End Date Taking? Authorizing Provider  alum & mag hydroxide-simeth (MAALOX/MYLANTA) 200-200-20 MG/5ML suspension Take 15 mLs by mouth every 6 (six) hours as needed. For indigestion    Historical Provider, MD  aspirin 81 MG chewable tablet Chew 81 mg by mouth daily.    Historical Provider, MD  Atorvastatin Calcium (LIPITOR PO) Take 5 mg by mouth at bedtime.    Historical Provider, MD  Cholecalciferol (VITAMIN D3) 1000 UNITS CAPS Take 1 capsule by mouth  daily.    Historical Provider, MD  Cranberry 475 MG CAPS Take 1 capsule by mouth 2 (two) times daily.    Historical Provider, MD  docusate sodium (COLACE) 250 MG capsule Take 250 mg by mouth daily.    Historical Provider, MD  donepezil (ARICEPT) 10 MG tablet Take 1 tablet (10 mg total) by mouth at bedtime. 02/12/12   Charm Rings, MD  DULoxetine (CYMBALTA) 30 MG capsule Take 30 mg by mouth daily. Take along with  capsule by mouth each day for a total of  by mouth daily.    Historical Provider, MD  DULoxetine (CYMBALTA) 60 MG capsule Take 60 mg by mouth daily. Take along with a 30 mg capsule for a total of  by mouth daily 02/12/12   Charm Rings, MD  fluocinolone (VANOS) 0.01 % cream Apply 1 application topically 2 (two) times daily as needed. To affected areas    Historical Provider, MD  gabapentin (NEURONTIN) 300 MG capsule Take 300 mg by mouth 2 (two) times daily. 02/12/12   Charm Rings, MD  HYDROcodone-acetaminophen (NORCO/VICODIN) 5-325 MG per tablet Take 1 tablet by mouth every 4 (four) hours as  needed for pain.    Historical Provider, MD  magnesium hydroxide (MILK OF MAGNESIA) 400 MG/5ML suspension Take 15 mLs by mouth daily as needed. For constipation    Historical Provider, MD  omeprazole (PRILOSEC) 20 MG capsule Take 20 mg by mouth daily.    Historical Provider, MD  oseltamivir (TAMIFLU) 75 MG capsule Take 1 capsule (75 mg total) by mouth every 12 (twelve) hours. 09/07/13   Rolan Bucco, MD  polyethylene glycol (MIRALAX / GLYCOLAX) packet Take 17 g by mouth daily. 09/07/13   Rolan Bucco, MD  propranolol (INDERAL) 10 MG tablet Take 1 tablet (10 mg total) by mouth 3 (three) times daily. 02/12/12 02/15/13  Charm Rings, MD  QUEtiapine (SEROQUEL) 25 MG tablet Take 1 tablet (25 mg total) by mouth 2 (two) times daily. Patient taking differently: Take 50 mg by mouth at bedtime.  02/12/12   Charm Rings, MD  SUMAtriptan (IMITREX) 50 MG tablet Take 50 mg by mouth every 2 (two) hours as needed. For  headache    Historical Provider, MD  topiramate (TOPAMAX) 50 MG tablet Take 50 mg by mouth 3 (three) times daily. 02/12/12   Charm Rings, MD  traMADol (ULTRAM) 50 MG tablet Take 1 tablet (50 mg total) by mouth 4 (four) times daily as needed. For pain 02/12/12   Charm Rings, MD  triamcinolone lotion (KENALOG) 0.1 % Apply 1 application topically daily. Apply to red itchy area every day directed    Historical Provider, MD   BP 208/97 mmHg  Pulse 99  Temp(Src) 98 F (36.7 C) (Axillary)  Resp 18  SpO2  Physical Exam Physical Exam  Nursing note and vitals reviewed. Constitutional:  She appears well-developed and well-nourished. No distress.  HENT:  Head: Normocephalic and scalp hematoma noted right occipital area Eyes: Pupils are equal, round, and reactive to light.  Neck: Normal range of motion.  Cardiovascular: Normal rate and intact distal pulses.   Pulmonary/Chest: No respiratory distress.  Abdominal: Normal appearance. She exhibits no distension.  Musculoskeletal: Normal range of motion.  Neurological: She is alert  No cranial nerve deficit.  patient moves all extremities with no lateralizing weakness.  According to staff she is at her baseline. Skin: Skin is warm and dry. No rash noted.    ED Course  Procedures (including critical care time) Labs Review Labs Reviewed - No data to display  Imaging Review Ct Head Wo Contrast  09/21/2014   CLINICAL DATA:  Unwitnessed fall with hematoma posteriorly. Altered mental status  EXAM: CT HEAD WITHOUT CONTRAST  CT CERVICAL SPINE WITHOUT CONTRAST  TECHNIQUE: Multidetector CT imaging of the head and cervical spine was performed following the standard protocol without intravenous contrast. Multiplanar CT image reconstructions of the cervical spine were also generated.  COMPARISON:  September 10, 2014  FINDINGS: CT HEAD FINDINGS  Moderate generalized atrophy is stable. There is no intracranial mass, hemorrhage, extra-axial fluid collection, or midline  shift. There is small vessel disease throughout the centra semiovale bilaterally, stable. There is small vessel disease age in each thalamus region, stable. There is no new gray-white compartment lesion. No acute infarct apparent. Bony calvarium appears intact. The mastoid air cells are clear. There is a right parietal scalp hematoma. There is a small retention cyst in left maxillary antrum. There is mucosal thickening in several ethmoid air cells. There is opacification in a left concha bullosa. There is rightward deviation of the nasal septum.  CT CERVICAL SPINE FINDINGS  There is no fracture  or spondylolisthesis. Prevertebral soft tissues and predental space regions are normal. There is moderate disc space narrowing at C5-6, C6-7, and C7-T1. There is facet hypertrophy at multiple levels bilaterally. There is no disc extrusion or stenosis.  IMPRESSION: CT head: Atrophy with supratentorial small vessel disease. No intracranial mass, hemorrhage, or acute appearing infarct. Right parietal scalp hematoma. Mild paranasal sinus disease.  CT cervical spine: Osteoarthritic change at multiple levels. No fracture or spondylolisthesis.   Electronically Signed   By: Bretta BangWilliam  Woodruff III M.D.   On: 09/21/2014 10:32   Ct Cervical Spine Wo Contrast  09/21/2014   CLINICAL DATA:  Unwitnessed fall with hematoma posteriorly. Altered mental status  EXAM: CT HEAD WITHOUT CONTRAST  CT CERVICAL SPINE WITHOUT CONTRAST  TECHNIQUE: Multidetector CT imaging of the head and cervical spine was performed following the standard protocol without intravenous contrast. Multiplanar CT image reconstructions of the cervical spine were also generated.  COMPARISON:  September 10, 2014  FINDINGS: CT HEAD FINDINGS  Moderate generalized atrophy is stable. There is no intracranial mass, hemorrhage, extra-axial fluid collection, or midline shift. There is small vessel disease throughout the centra semiovale bilaterally, stable. There is small vessel  disease age in each thalamus region, stable. There is no new gray-white compartment lesion. No acute infarct apparent. Bony calvarium appears intact. The mastoid air cells are clear. There is a right parietal scalp hematoma. There is a small retention cyst in left maxillary antrum. There is mucosal thickening in several ethmoid air cells. There is opacification in a left concha bullosa. There is rightward deviation of the nasal septum.  CT CERVICAL SPINE FINDINGS  There is no fracture or spondylolisthesis. Prevertebral soft tissues and predental space regions are normal. There is moderate disc space narrowing at C5-6, C6-7, and C7-T1. There is facet hypertrophy at multiple levels bilaterally. There is no disc extrusion or stenosis.  IMPRESSION: CT head: Atrophy with supratentorial small vessel disease. No intracranial mass, hemorrhage, or acute appearing infarct. Right parietal scalp hematoma. Mild paranasal sinus disease.  CT cervical spine: Osteoarthritic change at multiple levels. No fracture or spondylolisthesis.   Electronically Signed   By: Bretta BangWilliam  Woodruff III M.D.   On: 09/21/2014 10:32      MDM   Final diagnoses:  Accidental fall  Scalp hematoma, initial encounter        Nelia Shiobert L Victorya Hillman, MD 09/21/14 1058

## 2014-09-21 NOTE — ED Notes (Signed)
Pt comes in with EMS from [facility]. Per EMS pt was found on the floor with an unwitnessed fall. Pt has noted hematoma to the posterior area on her head. Pt alert but disoriented. Pt does have a C-collar and KED on.

## 2014-09-21 NOTE — Discharge Instructions (Signed)
Contusion A contusion is a deep bruise. Contusions are the result of an injury that caused bleeding under the skin. The contusion may turn blue, purple, or yellow. Minor injuries will give you a painless contusion, but more severe contusions may stay painful and swollen for a few weeks.  CAUSES  A contusion is usually caused by a blow, trauma, or direct force to an area of the body. SYMPTOMS   Swelling and redness of the injured area.  Bruising of the injured area.  Tenderness and soreness of the injured area.  Pain. DIAGNOSIS  The diagnosis can be made by taking a history and physical exam. An X-ray, CT scan, or MRI may be needed to determine if there were any associated injuries, such as fractures. TREATMENT  Specific treatment will depend on what area of the body was injured. In general, the best treatment for a contusion is resting, icing, elevating, and applying cold compresses to the injured area. Over-the-counter medicines may also be recommended for pain control. Ask your caregiver what the best treatment is for your contusion. HOME CARE INSTRUCTIONS   Put ice on the injured area.  Put ice in a plastic bag.  Place a towel between your skin and the bag.  Leave the ice on for 15-20 minutes, 3-4 times a day, or as directed by your health care provider.  Only take over-the-counter or prescription medicines for pain, discomfort, or fever as directed by your caregiver. Your caregiver may recommend avoiding anti-inflammatory medicines (aspirin, ibuprofen, and naproxen) for 48 hours because these medicines may increase bruising.  Rest the injured area.  If possible, elevate the injured area to reduce swelling. SEEK IMMEDIATE MEDICAL CARE IF:   You have increased bruising or swelling.  You have pain that is getting worse.  Your swelling or pain is not relieved with medicines. MAKE SURE YOU:   Understand these instructions.  Will watch your condition.  Will get help right  away if you are not doing well or get worse. Document Released: 05/09/2005 Document Revised: 08/04/2013 Document Reviewed: 06/04/2011 [facility] Patient Information 2015 [place], [place]. This information is not intended to replace advice given to you by your health care provider. Make sure you discuss any questions you have with your health care provider.  Fall Prevention in Hospitals As a hospital patient, your condition and the treatments you receive can increase your risk for falls. Some additional risk factors for falls in a hospital include:  Being in an unfamiliar environment.  Being on bed rest.  Your surgery.  Taking certain medicines.  Your tubing requirements, such as intravenous (IV) therapy or catheters. It is important that you learn how to decrease fall risks while at the hospital. Below are important tips that can help prevent falls. SAFETY TIPS FOR PREVENTING FALLS Talk about your risk of falling.  Ask your caregiver why you are at risk for falling. Is it your medicine, illness, tubing placement, or something else?  Make a plan with your caregiver to keep you safe from falls.  Ask your caregiver or pharmacist about side effect of your medicines. Some medicines can make you dizzy or affect your coordination. Ask for help.  Ask for help before getting out of bed. You may need to press your call button.  Ask for assistance in getting you safely to the toilet.  Ask for a walker or cane to be put at your bedside. Ask that most of the side rails on your bed be placed up before your caregiver  leaves the room.  Ask family or friends to sit with you.  Ask for things that are out of your reach, such as your glasses, hearing aids, telephone, bedside table, or call button. Follow these tips to avoid falling:  Stay lying or seated, rather than standing, while waiting for help.  Wear rubber-soled slippers or shoes whenever you walk in the hospital.  Avoid quick, sudden  movements.  Change positions slowly.  Sit on the side of your bed before standing.  Stand up slowly and wait before you start to walk.  Let your caregiver know if there is a spill on the floor.  Pay careful attention to the medical equipment, electrical cords, and tubes around you.  When you need help, use your call button by your bed or in the bathroom. Wait for one of your caregivers to help you.  If you feel dizzy or unsure of your footing, return to bed and wait for assistance.  Avoid being distracted by the TV, telephone, or another person in your room.  Do not lean or support yourself on rolling objects, such as IV poles or bedside tables. Document Released: 07/27/2000 Document Revised: 07/16/2012 Document Reviewed: 04/06/2012 [facility] Patient Information 2015 [place]. This information is not intended to replace advice given to you by your health care provider. Make sure you discuss any questions you have with your health care provider.

## 2014-10-04 ENCOUNTER — Emergency Department (HOSPITAL_BASED_OUTPATIENT_CLINIC_OR_DEPARTMENT_OTHER): Payer: Medicare Other

## 2014-10-04 ENCOUNTER — Emergency Department (HOSPITAL_BASED_OUTPATIENT_CLINIC_OR_DEPARTMENT_OTHER)
Admission: EM | Admit: 2014-10-04 | Discharge: 2014-10-04 | Disposition: A | Discharge status: Home / Self Care | Payer: Medicare Other | Attending: Emergency Medicine | Admitting: Emergency Medicine

## 2014-10-04 ENCOUNTER — Encounter (HOSPITAL_BASED_OUTPATIENT_CLINIC_OR_DEPARTMENT_OTHER): Payer: Self-pay | Admitting: *Deleted

## 2014-10-04 DIAGNOSIS — Z79899 Other long term (current) drug therapy: Secondary | ICD-10-CM | POA: Diagnosis not present

## 2014-10-04 DIAGNOSIS — K5900 Constipation, unspecified: Secondary | ICD-10-CM | POA: Diagnosis not present

## 2014-10-04 DIAGNOSIS — F0390 Unspecified dementia without behavioral disturbance: Secondary | ICD-10-CM | POA: Diagnosis not present

## 2014-10-04 DIAGNOSIS — Z8711 Personal history of peptic ulcer disease: Secondary | ICD-10-CM | POA: Insufficient documentation

## 2014-10-04 DIAGNOSIS — W1839XA Other fall on same level, initial encounter: Secondary | ICD-10-CM | POA: Diagnosis not present

## 2014-10-04 DIAGNOSIS — G629 Polyneuropathy, unspecified: Secondary | ICD-10-CM | POA: Diagnosis not present

## 2014-10-04 DIAGNOSIS — I10 Essential (primary) hypertension: Secondary | ICD-10-CM | POA: Insufficient documentation

## 2014-10-04 DIAGNOSIS — W19XXXA Unspecified fall, initial encounter: Secondary | ICD-10-CM

## 2014-10-04 DIAGNOSIS — Z872 Personal history of diseases of the skin and subcutaneous tissue: Secondary | ICD-10-CM | POA: Insufficient documentation

## 2014-10-04 DIAGNOSIS — Y9289 Other specified places as the place of occurrence of the external cause: Secondary | ICD-10-CM | POA: Diagnosis not present

## 2014-10-04 DIAGNOSIS — F329 Major depressive disorder, single episode, unspecified: Secondary | ICD-10-CM | POA: Diagnosis not present

## 2014-10-04 DIAGNOSIS — Y9389 Activity, other specified: Secondary | ICD-10-CM | POA: Insufficient documentation

## 2014-10-04 DIAGNOSIS — H9190 Unspecified hearing loss, unspecified ear: Secondary | ICD-10-CM | POA: Insufficient documentation

## 2014-10-04 DIAGNOSIS — S0990XA Unspecified injury of head, initial encounter: Secondary | ICD-10-CM | POA: Diagnosis present

## 2014-10-04 DIAGNOSIS — Z7982 Long term (current) use of aspirin: Secondary | ICD-10-CM | POA: Insufficient documentation

## 2014-10-04 DIAGNOSIS — G8929 Other chronic pain: Secondary | ICD-10-CM | POA: Insufficient documentation

## 2014-10-04 DIAGNOSIS — Y998 Other external cause status: Secondary | ICD-10-CM | POA: Diagnosis not present

## 2014-10-04 MED ORDER — LIDOCAINE-EPINEPHRINE-TETRACAINE (LET) SOLUTION: 3.0000 mL | Freq: Once | NASAL | Status: DC

## 2014-10-04 NOTE — ED Notes (Signed)
Patient transported to X-ray 

## 2014-10-04 NOTE — ED Notes (Signed)
Pt to room 6 by ems, moved to stretcher x 3 max assist. Pt is alert, smiling, at her baseline mental status per ems. Ems called to [facility] for report of pt fall, hematoma noted to posterior scalp, pt grimaces when area is palpated, no bleeding or open areas noted. Pt denies any c/o, but is at her baseline ms, pleasantly confused. Moe + x 4 ext, hips are = length, no shortening or rotation noted.

## 2014-10-04 NOTE — ED Notes (Signed)
ptar called for pt transport back to [facility].

## 2014-10-04 NOTE — ED Provider Notes (Signed)
CSN: [id number]     Arrival date & time 10/04/14  1614 History  This chart was scribed for Rolan Bucco, MD by Haywood Pao, ED Scribe. The patient was seen in [facility] and the patient's care was started at 4:30 PM.  Chief Complaint  Patient presents with  . Fall  . Head Injury  LEVEL 5 CAVEAT  The history is limited by the condition of the patient. No language interpreter was used.    HPI Comments: Sarah Alvarado is a 81 y.o. female brought in by ambulance, who presents to the Emergency Department due to a fall which occurred today at [facility]. HPI is unattainable due to dementia.  Pt was found on floor after an apparent unwittnessed fall.  Has hx of frequent fall.  Past Medical History  Diagnosis Date  . Hypertension   . Depression   . Dementia   . Back pain   . Neuropathy     peripheral  . Constipation   . Eczema   . Peptic ulcer   . Peripheral neuropathy   . Aortic aneurysm   . Allergic rhinitis   . IC (irritable colon)   . Hearing loss   . Tremor   . Eczema   . HA (headache)   . Back pain   . Aortic valve disease   . Diverticulitis   . Chronic left hip pain   . Chronic low back pain   . PVD (peripheral vascular disease)    Past Surgical History  Procedure Laterality Date  .  unable to obtain due to dementia history      No family history on file. History  Substance Use Topics  . Smoking status: Unknown If Ever Smoked  . Smokeless tobacco: Not on file  . Alcohol Use: No   OB History    No data available     Review of Systems  Unable to perform ROS: Dementia   Allergies  Review of patient's allergies indicates no known allergies.  Home Medications   Prior to Admission medications   Medication Sig Start Date End Date Taking? Authorizing Provider  Dextromethorphan-Quinidine 20-10 MG CAPS Take by mouth.   Yes Historical Provider, MD  alum & mag hydroxide-simeth (MAALOX/MYLANTA) 200-200-20 MG/5ML suspension Take 15 mLs by mouth  every 6 (six) hours as needed. For indigestion    Historical Provider, MD  aspirin 81 MG chewable tablet Chew 81 mg by mouth daily.    Historical Provider, MD  Atorvastatin Calcium (LIPITOR PO) Take 5 mg by mouth at bedtime.    Historical Provider, MD  Cholecalciferol (VITAMIN D3) 1000 UNITS CAPS Take 1 capsule by mouth daily.    Historical Provider, MD  Cranberry 475 MG CAPS Take 1 capsule by mouth 2 (two) times daily.    Historical Provider, MD  docusate sodium (COLACE) 250 MG capsule Take 250 mg by mouth daily.    Historical Provider, MD  donepezil (ARICEPT) 10 MG tablet Take 1 tablet (10 mg total) by mouth at bedtime. 02/12/12   Charm Rings, MD  DULoxetine (CYMBALTA) 30 MG capsule Take 30 mg by mouth daily. Take along with  capsule by mouth each day for a total of  by mouth daily.    Historical Provider, MD  DULoxetine (CYMBALTA) 60 MG capsule Take 60 mg by mouth daily. Take along with a 30 mg capsule for a total of  by mouth daily 02/12/12   Charm Rings, MD  fluocinolone (VANOS) 0.01 % cream Apply 1  application topically 2 (two) times daily as needed. To affected areas    Historical Provider, MD  gabapentin (NEURONTIN) 300 MG capsule Take 300 mg by mouth 2 (two) times daily. 02/12/12   Charm RingsErin J Honig, MD  HYDROcodone-acetaminophen (NORCO/VICODIN) 5-325 MG per tablet Take 1 tablet by mouth every 4 (four) hours as needed for pain.    Historical Provider, MD  magnesium hydroxide (MILK OF MAGNESIA) 400 MG/5ML suspension Take 15 mLs by mouth daily as needed. For constipation    Historical Provider, MD  omeprazole (PRILOSEC) 20 MG capsule Take 20 mg by mouth daily.    Historical Provider, MD  oseltamivir (TAMIFLU) 75 MG capsule Take 1 capsule (75 mg total) by mouth every 12 (twelve) hours. 09/07/13   Rolan BuccoMelanie Madonna Flegal, MD  polyethylene glycol (MIRALAX / GLYCOLAX) packet Take 17 g by mouth daily. 09/07/13   Rolan BuccoMelanie Ersel Enslin, MD  propranolol (INDERAL) 10 MG tablet Take 1 tablet (10 mg total) by mouth 3  (three) times daily. 02/12/12 02/15/13  Charm RingsErin J Honig, MD  QUEtiapine (SEROQUEL) 25 MG tablet Take 1 tablet (25 mg total) by mouth 2 (two) times daily. Patient taking differently: Take 50 mg by mouth at bedtime.  02/12/12   Charm RingsErin J Honig, MD  SUMAtriptan (IMITREX) 50 MG tablet Take 50 mg by mouth every 2 (two) hours as needed. For headache    Historical Provider, MD  topiramate (TOPAMAX) 50 MG tablet Take 50 mg by mouth 3 (three) times daily. 02/12/12   Charm RingsErin J Honig, MD  traMADol (ULTRAM) 50 MG tablet Take 1 tablet (50 mg total) by mouth 4 (four) times daily as needed. For pain 02/12/12   Charm RingsErin J Honig, MD  triamcinolone lotion (KENALOG) 0.1 % Apply 1 application topically daily. Apply to red itchy area every day directed    Historical Provider, MD   BP 192/116 mmHg  Pulse 76  Temp(Src) 98.2 F (36.8 C) (Oral)  SpO2 100% Physical Exam  Constitutional: She appears well-developed and well-nourished.  HENT:  Head: Normocephalic and atraumatic.  Small hematoma to her posterior scalp.   Eyes: Pupils are equal, round, and reactive to light.  Neck: Normal range of motion. Neck supple.  Cardiovascular: Normal rate, regular rhythm and normal heart sounds.   Pulmonary/Chest: Effort normal and breath sounds normal. No respiratory distress. She has no wheezes. She has no rales. She exhibits no tenderness.  Abdominal: Soft. Bowel sounds are normal. There is no tenderness. There is no rebound and no guarding.  Musculoskeletal: Normal range of motion. She exhibits no edema.  No palpable tenderness along the spine. No pain on ROM of the extremities including the hips.  Lymphadenopathy:    She has no cervical adenopathy.  Neurological: She is alert.  confused  Skin: Skin is warm and dry. No rash noted.  Psychiatric: She has a normal mood and affect.    ED Course  Procedures  DIAGNOSTIC STUDIES: Oxygen Saturation is 100% on room air, normal by my interpretation.    COORDINATION OF CARE: 4:36 PM Discussed  treatment plan with pt at bedside and pt agreed to plan.  Labs Review Labs Reviewed - No data to display  Imaging Review Ct Head Wo Contrast  10/04/2014   CLINICAL DATA:  Patient fell with pain  EXAM: CT HEAD WITHOUT CONTRAST  CT CERVICAL SPINE WITHOUT CONTRAST  TECHNIQUE: Multidetector CT imaging of the head and cervical spine was performed following the standard protocol without intravenous contrast. Multiplanar CT image reconstructions of the cervical spine were  also generated.  COMPARISON:  September 21, 2014  FINDINGS: CT HEAD FINDINGS  Moderate diffuse atrophy remain stable. There is no intracranial mass, hemorrhage, extra-axial fluid collection, or midline shift. There is small vessel disease throughout the centra semiovale bilaterally, stable. No new gray-white compartment lesion. No acute infarct apparent.  There is a right parietal scalp hematoma. Bony calvarium appears intact. The mastoid air cells are clear. There is debris in each external auditory canal. There are retention cysts in the left maxillary antrum.  CT CERVICAL SPINE FINDINGS  There is no fracture or spondylolisthesis. Prevertebral soft tissues and predental space regions are normal. There is moderate disc space narrowing at C5-6 and C6-7. There is slightly milder narrowing at C7-T1. There is facet hypertrophy at multiple levels without nerve root edema or effacement. No disc extrusion or stenosis. There is calcification in each carotid artery.  IMPRESSION: CT head: Atrophy with periventricular small vessel disease, stable. No intracranial mass, hemorrhage, or extra-axial fluid. No acute infarct. Right parietal scalp hematoma. No fractures. Probable cerumen in each external auditory canal. Retention cysts in the left maxillary antrum.  CT cervical spine: No apparent fracture or spondylolisthesis. Multilevel osteoarthritic change. Carotid artery calcification bilaterally.   Electronically Signed   By: Bretta Bang III M.D.   On:  10/04/2014 17:26   Ct Cervical Spine Wo Contrast  10/04/2014   CLINICAL DATA:  Patient fell with pain  EXAM: CT HEAD WITHOUT CONTRAST  CT CERVICAL SPINE WITHOUT CONTRAST  TECHNIQUE: Multidetector CT imaging of the head and cervical spine was performed following the standard protocol without intravenous contrast. Multiplanar CT image reconstructions of the cervical spine were also generated.  COMPARISON:  September 21, 2014  FINDINGS: CT HEAD FINDINGS  Moderate diffuse atrophy remain stable. There is no intracranial mass, hemorrhage, extra-axial fluid collection, or midline shift. There is small vessel disease throughout the centra semiovale bilaterally, stable. No new gray-white compartment lesion. No acute infarct apparent.  There is a right parietal scalp hematoma. Bony calvarium appears intact. The mastoid air cells are clear. There is debris in each external auditory canal. There are retention cysts in the left maxillary antrum.  CT CERVICAL SPINE FINDINGS  There is no fracture or spondylolisthesis. Prevertebral soft tissues and predental space regions are normal. There is moderate disc space narrowing at C5-6 and C6-7. There is slightly milder narrowing at C7-T1. There is facet hypertrophy at multiple levels without nerve root edema or effacement. No disc extrusion or stenosis. There is calcification in each carotid artery.  IMPRESSION: CT head: Atrophy with periventricular small vessel disease, stable. No intracranial mass, hemorrhage, or extra-axial fluid. No acute infarct. Right parietal scalp hematoma. No fractures. Probable cerumen in each external auditory canal. Retention cysts in the left maxillary antrum.  CT cervical spine: No apparent fracture or spondylolisthesis. Multilevel osteoarthritic change. Carotid artery calcification bilaterally.   Electronically Signed   By: Bretta Bang III M.D.   On: 10/04/2014 17:26     EKG Interpretation None      MDM   Final diagnoses:  Fall, initial  encounter  Head injury, initial encounter   No ICH or c-spine fracture seen.  Pt alert, happy.  No other apparent injury.  Transported back to NH  I personally performed the services described in this documentation, which was scribed in my presence.  The recorded information has been reviewed and considered.      Rolan Bucco, MD 10/04/14 [phone]

## 2014-10-04 NOTE — Discharge Instructions (Signed)

## 2016-07-19 IMAGING — CT CT HEAD W/O CM
4 of 5 series · 17 of 47 positions shown, 18 images · non-contrast
Comparison: Head CT January 19, 2014; cervical spine CT December 02, 2013

CLINICAL DATA: Patient fell with pain after fall

EXAM:
CT HEAD WITHOUT CONTRAST
CT CERVICAL SPINE WITHOUT CONTRAST
TECHNIQUE: Multidetector CT imaging of the head and cervical spine was
performed following the standard protocol without intravenous
contrast. Multiplanar CT image reconstructions of the cervical spine
were also generated.

[Series 2: head 4.8 h37s · axial · 0.45mm/px · z∈[+1341,+1431]mm · 3 of 36 slices shown, 4 images]
[im 9/36  brain]
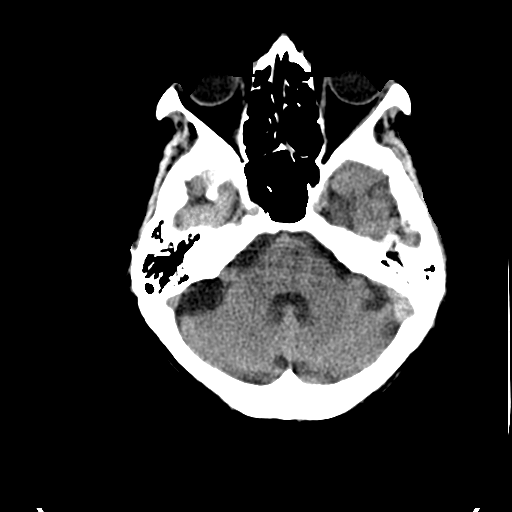
[im 9/36  bone]
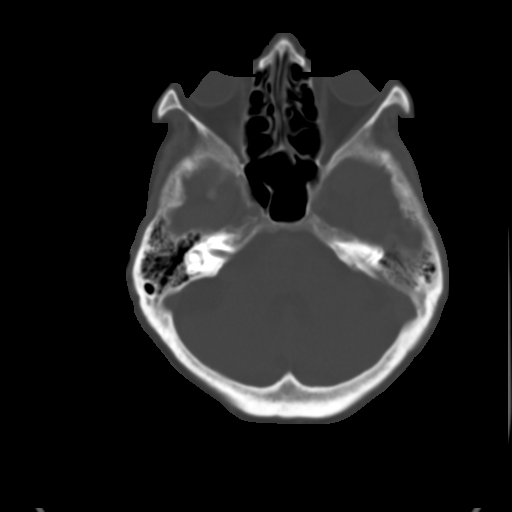
[im 18/36  brain]
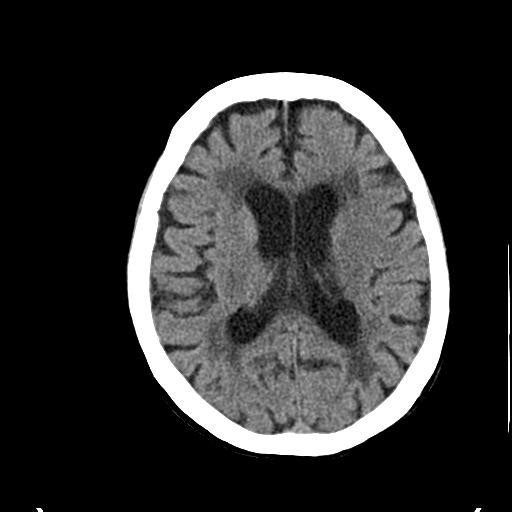
[im 27/36  brain]
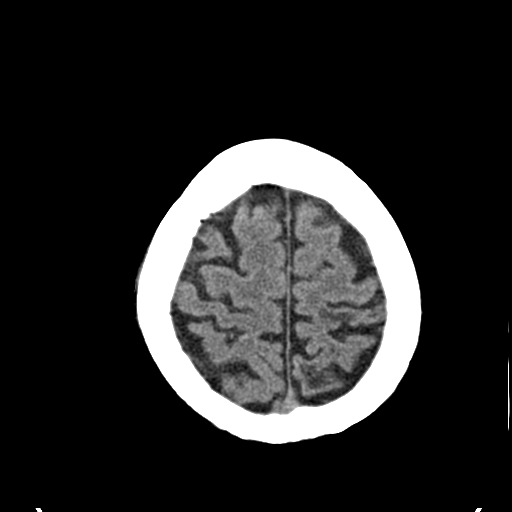

[Series 8: c_spine 2.0 coronal · coronal · 0.30mm/px · 3 of 72 slices shown]
[im 24/72  brain]
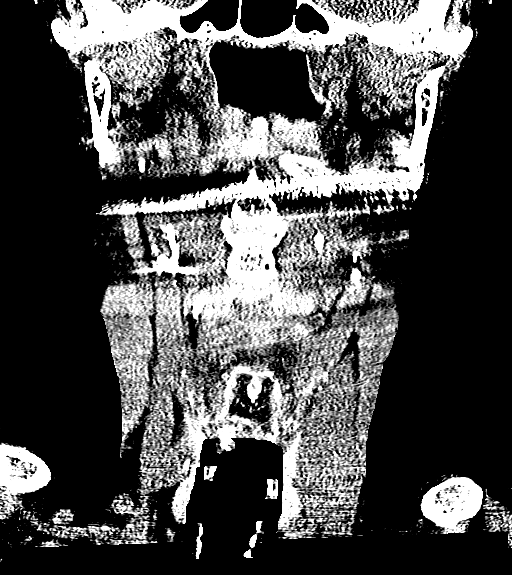
[im 32/72  brain]
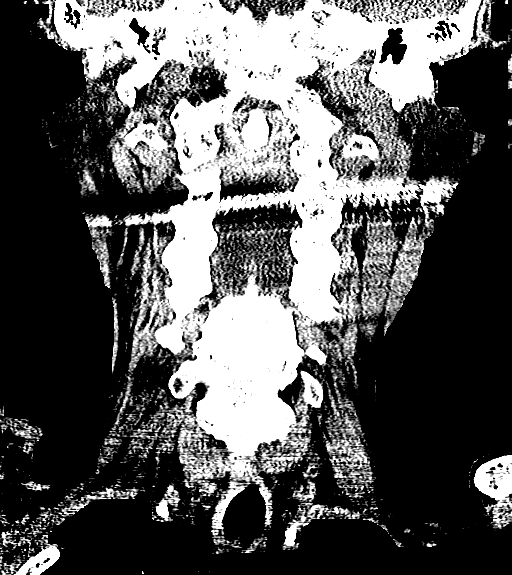
[im 40/72  brain]
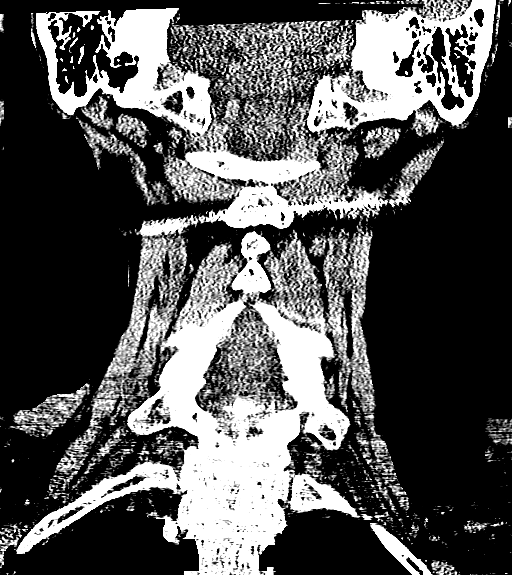

[Series 9: c_spine 2.0 sagittal · sagittal · 0.30mm/px · 3 of 73 slices shown]
[im 25/73  brain]
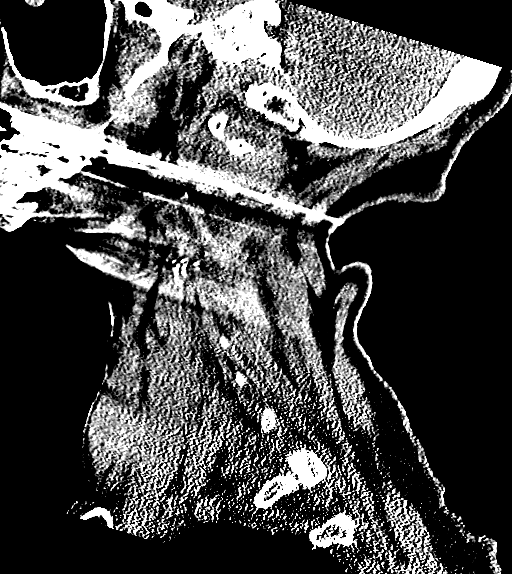
[im 37/73  brain]
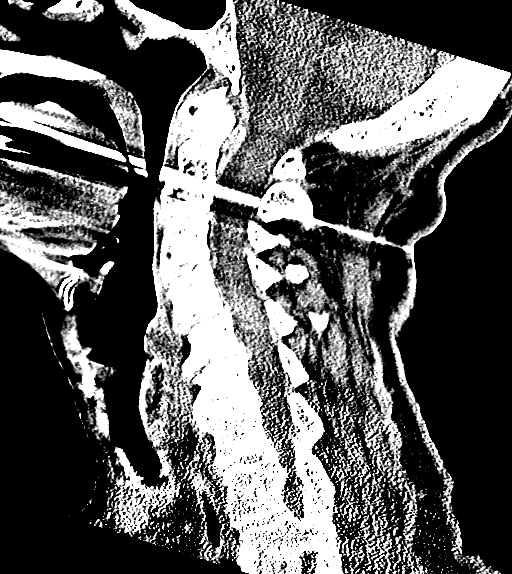
[im 49/73  brain]
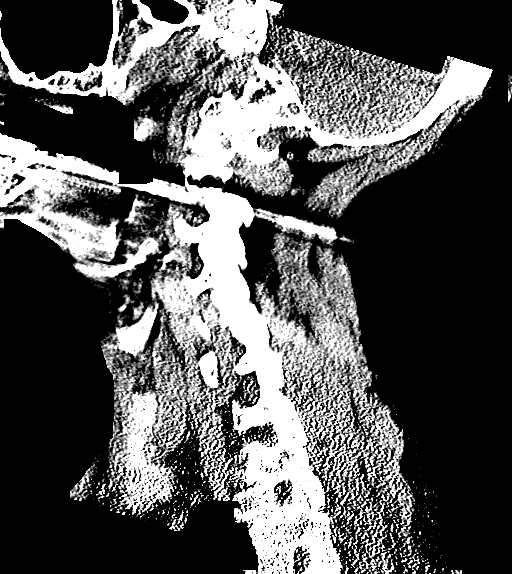

[Series 10: c_spine 2.0 orth ax · axial · 0.31mm/px · z∈[+1137,+1275]mm · 8 of 88 slices shown]
[im 8/88  brain]
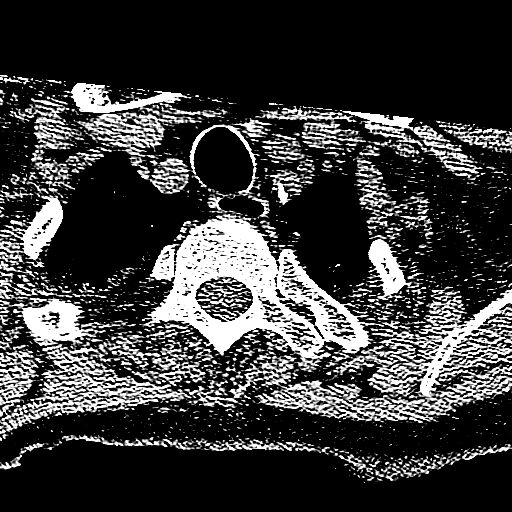
[im 22/88  brain]
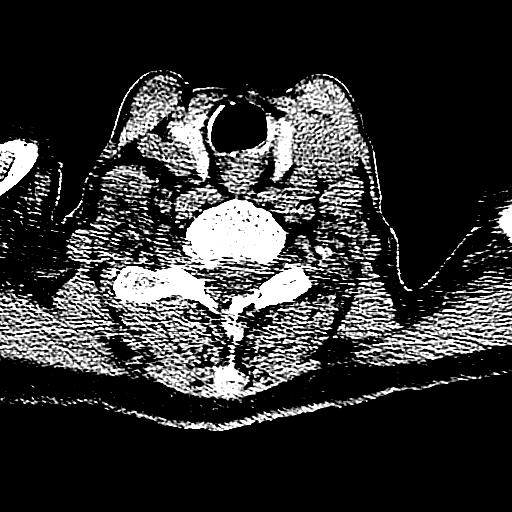
[im 30/88  brain]
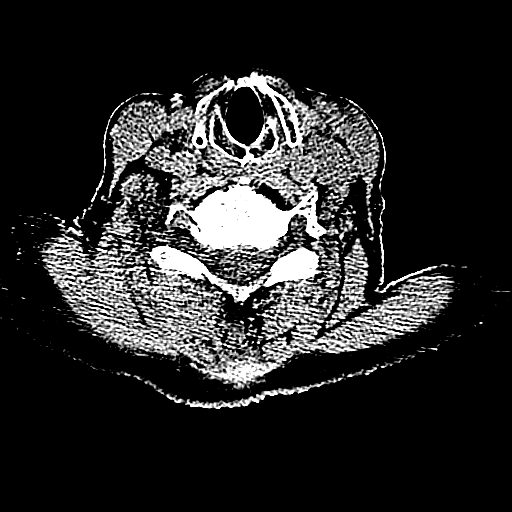
[im 37/88  brain]
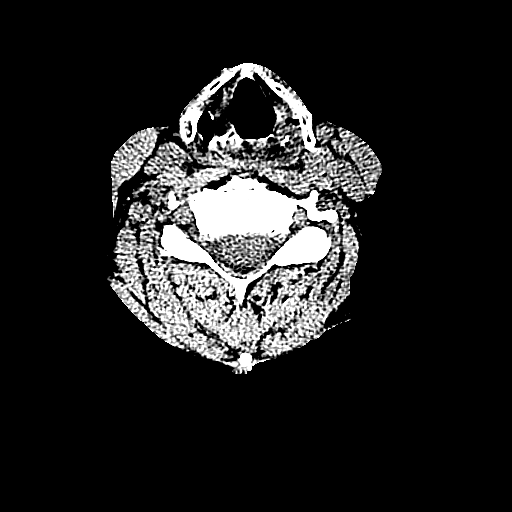
[im 51/88  brain]
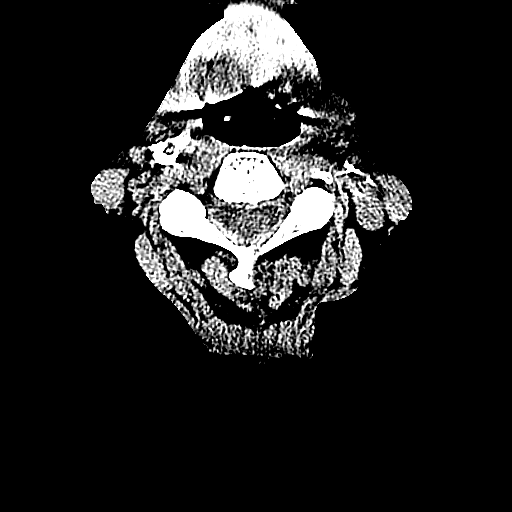
[im 59/88  brain]
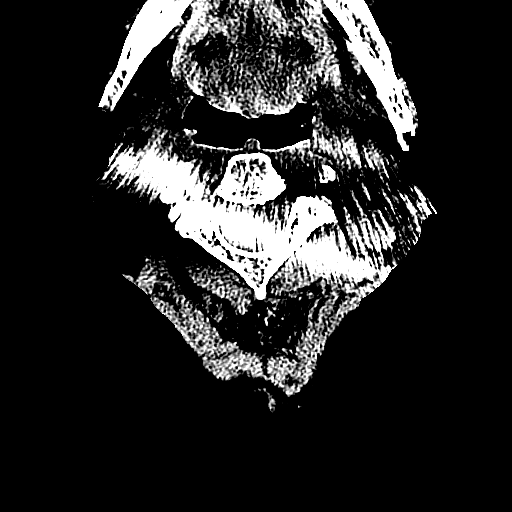
[im 66/88  brain]
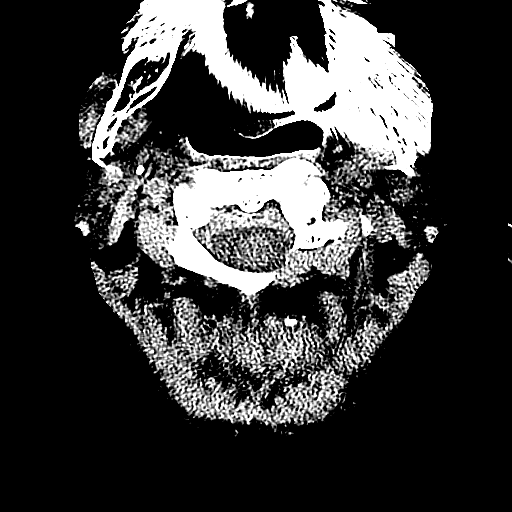
[im 80/88  brain]
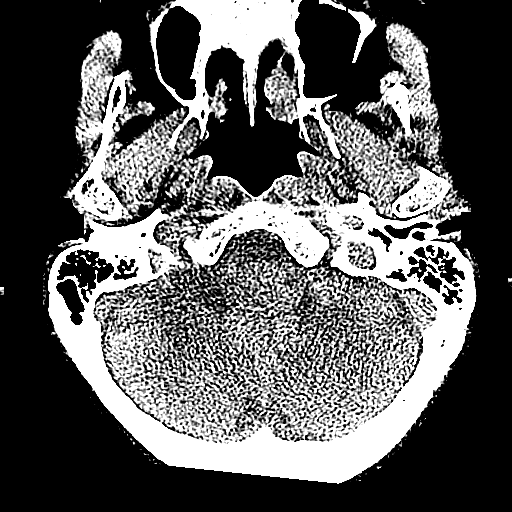

[17 of 47 positions shown; findings below may reference images not displayed]

FINDINGS: CT HEAD FINDINGS

There is moderate diffuse atrophy. There is no mass, hemorrhage,
extra-axial fluid collection, or midline shift. There is small
vessel disease, patchy, throughout the centra semiovale bilaterally,
stable. There is no new gray-white compartment lesion. No acute
infarct apparent. The bony calvarium appears intact. The mastoid air
cells are clear. There are small retention cysts in the left
maxillary antrum. There is rightward deviation of the nasal septum.
There is a concha bullosa on the left, a stable anatomic variant.

CT CERVICAL SPINE FINDINGS

There is no appreciable fracture or spondylolisthesis. Prevertebral
soft tissues and predental space regions are normal. There is
moderately severe disc space narrowing at C5-6 and C6-7. There is
milder disc space narrowing at C2-C3 and C3-4. There is multilevel
facet osteoarthritic change. No frank disc extrusion or stenosis.
There is calcification in each carotid artery. There is a small
benign appearing cyst in the rightward aspect of the C3 vertebral
body, stable.
IMPRESSION: CT head: Atrophy with patchy supratentorial small vessel disease. No
intracranial mass, hemorrhage, or extra-axial fluid collection. No
acute infarct apparent. Mild paranasal sinus disease.

CT cervical spine: Multilevel osteoarthritic change. No fracture or
spondylolisthesis. There is calcification in both carotid arteries.
